# Patient Record
Sex: Female | Born: 1945
Health system: Southern US, Community
[De-identification: ages and names within clinical notes are randomized; demographics above are authoritative.]

## PROBLEM LIST (undated history)

## (undated) DIAGNOSIS — I10 Essential (primary) hypertension: Secondary | ICD-10-CM

## (undated) DIAGNOSIS — M1990 Unspecified osteoarthritis, unspecified site: Secondary | ICD-10-CM

## (undated) DIAGNOSIS — K219 Gastro-esophageal reflux disease without esophagitis: Secondary | ICD-10-CM

## (undated) DIAGNOSIS — D649 Anemia, unspecified: Secondary | ICD-10-CM

## (undated) DIAGNOSIS — M810 Age-related osteoporosis without current pathological fracture: Secondary | ICD-10-CM

## (undated) DIAGNOSIS — E785 Hyperlipidemia, unspecified: Secondary | ICD-10-CM

## (undated) DIAGNOSIS — F329 Major depressive disorder, single episode, unspecified: Secondary | ICD-10-CM

## (undated) DIAGNOSIS — N189 Chronic kidney disease, unspecified: Secondary | ICD-10-CM

## (undated) DIAGNOSIS — K269 Duodenal ulcer, unspecified as acute or chronic, without hemorrhage or perforation: Secondary | ICD-10-CM

## (undated) DIAGNOSIS — G709 Myoneural disorder, unspecified: Secondary | ICD-10-CM

## (undated) DIAGNOSIS — T7840XA Allergy, unspecified, initial encounter: Secondary | ICD-10-CM

## (undated) DIAGNOSIS — H269 Unspecified cataract: Secondary | ICD-10-CM

## (undated) DIAGNOSIS — F32A Depression, unspecified: Secondary | ICD-10-CM

## (undated) HISTORY — PX: BARIATRIC SURGERY: SHX1103

## (undated) HISTORY — DX: Myoneural disorder, unspecified: G70.9

## (undated) HISTORY — DX: Hyperlipidemia, unspecified: E78.5

## (undated) HISTORY — DX: Major depressive disorder, single episode, unspecified: F32.9

## (undated) HISTORY — DX: Depression, unspecified: F32.A

## (undated) HISTORY — DX: Allergy, unspecified, initial encounter: T78.40XA

## (undated) HISTORY — DX: Unspecified osteoarthritis, unspecified site: M19.90

## (undated) HISTORY — DX: Age-related osteoporosis without current pathological fracture: M81.0

## (undated) HISTORY — PX: EYE SURGERY: SHX253

## (undated) HISTORY — PX: COLONOSCOPY: SHX174

## (undated) HISTORY — DX: Gastro-esophageal reflux disease without esophagitis: K21.9

## (undated) HISTORY — PX: TONSILLECTOMY: SUR1361

## (undated) HISTORY — DX: Duodenal ulcer, unspecified as acute or chronic, without hemorrhage or perforation: K26.9

## (undated) HISTORY — PX: OTHER SURGICAL HISTORY: SHX169

## (undated) HISTORY — PX: ABDOMINAL HYSTERECTOMY: SHX81

## (undated) HISTORY — DX: Chronic kidney disease, unspecified: N18.9

## (undated) HISTORY — DX: Anemia, unspecified: D64.9

## (undated) HISTORY — DX: Essential (primary) hypertension: I10

## (undated) HISTORY — DX: Unspecified cataract: H26.9

## (undated) HISTORY — PX: POLYPECTOMY: SHX149

## (undated) HISTORY — PX: APPENDECTOMY: SHX54

---

## 2003-09-03 HISTORY — PX: OTHER SURGICAL HISTORY: SHX169

## 2013-02-17 ENCOUNTER — Encounter: Payer: Self-pay | Admitting: Gastroenterology

## 2013-03-09 ENCOUNTER — Ambulatory Visit (INDEPENDENT_AMBULATORY_CARE_PROVIDER_SITE_OTHER): Payer: Medicare Other | Admitting: Family Medicine

## 2013-03-09 VITALS — BP 120/78 | HR 58 | Temp 97.9°F | Resp 18 | Ht 62.5 in | Wt 193.0 lb

## 2013-03-09 DIAGNOSIS — J309 Allergic rhinitis, unspecified: Secondary | ICD-10-CM | POA: Diagnosis not present

## 2013-03-09 DIAGNOSIS — H1010 Acute atopic conjunctivitis, unspecified eye: Secondary | ICD-10-CM | POA: Diagnosis not present

## 2013-03-09 DIAGNOSIS — H1013 Acute atopic conjunctivitis, bilateral: Secondary | ICD-10-CM

## 2013-03-09 DIAGNOSIS — J329 Chronic sinusitis, unspecified: Secondary | ICD-10-CM

## 2013-03-09 MED ORDER — AZELASTINE HCL 0.05 % OP SOLN: 1.0000 [drp] | Freq: Two times a day (BID) | OPHTHALMIC | Status: DC

## 2013-03-09 MED ORDER — FEXOFENADINE HCL 180 MG PO TABS: 180.0000 mg | ORAL_TABLET | Freq: Every day | ORAL | Status: DC

## 2013-03-09 MED ORDER — PREDNISONE 20 MG PO TABS: ORAL_TABLET | ORAL | Status: DC

## 2013-03-09 MED ORDER — AMOXICILLIN-POT CLAVULANATE 875-125 MG PO TABS: 1.0000 | ORAL_TABLET | Freq: Two times a day (BID) | ORAL | Status: DC

## 2013-03-09 MED ORDER — GUAIFENESIN-CODEINE 100-10 MG/5ML PO SYRP: 5.0000 mL | ORAL_SOLUTION | Freq: Three times a day (TID) | ORAL | Status: DC | PRN

## 2013-03-09 MED ORDER — FLUTICASONE PROPIONATE 50 MCG/ACT NA SUSP: 2.0000 | Freq: Every day | NASAL | Status: DC

## 2013-03-09 NOTE — Patient Instructions (Addendum)
Hot showers or breathing in steam may help loosen the congestion.  Using a netti pot or sinus rinse is also likely to help you feel better and keep this from progressing.  Use the fluticasone nasal spray every night before bed for at least 2 weeks minimum and start daily allegra - if this doesn't work, switch to cetirizine (generic zyrtec).  If no improvement or you are getting worse, come back but hopefully with all of the above, you can avoid it.

## 2013-03-09 NOTE — Progress Notes (Signed)
Subjective:    Patient ID: Rebecca Lopez, female    DOB: [birth year], 66 y.o.   MRN: [id number] Chief Complaint  Patient presents with  . sore throat, cough, watery eyes x5 days   HPI  Started not feeling well about 4d ago but worsened and felt miserable all day and couldn't get out of bed.  Her eyes are really watery and burning. In the morning, her eyes are crusted and constantly weeping bilaterally clear liquid.  Having rhinitis - though this tends to be chronic due to various allergies.  Having a lot of sinus congestion and pain with progressive jaw pain, is having bilateral ear pain and severe sore throat - very painful.  No tinnitus or decreased hearing. No ear drainage but lots of post-nasal drip and coughing up lots of phlegm - did have a few streaks of blood in it last year.  No SHoB or CP. Decreased po, no f/c.  Just got a cat and so is worried she is having an allergic reaction to it.  Has been using allegra and some ibuprofen.    Past Medical History  Diagnosis Date  . Depression   . Allergy   . Arthritis   . Hyperlipidemia    No current outpatient prescriptions on file prior to visit.   No current facility-administered medications on file prior to visit.   No Known Allergies  Review of Systems  Constitutional: Positive for diaphoresis, activity change, appetite change and fatigue. Negative for fever and chills.  HENT: Positive for ear pain, congestion, sore throat, rhinorrhea, sneezing, postnasal drip and sinus pressure. Negative for hearing loss, nosebleeds, facial swelling, trouble swallowing, neck pain, neck stiffness, dental problem, voice change, tinnitus and ear discharge.   Eyes: Positive for pain, discharge, redness and itching. Negative for photophobia and visual disturbance.  Respiratory: Positive for cough. Negative for chest tightness, shortness of breath and wheezing.   Cardiovascular: Negative for chest pain.  Gastrointestinal: Negative for nausea, vomiting and  abdominal pain.  Skin: Negative for rash.  Neurological: Positive for headaches. Negative for dizziness and syncope.  Hematological: Negative for adenopathy.  Psychiatric/Behavioral: Positive for sleep disturbance.      BP 120/78  Pulse 58  Temp(Src) 97.9 F (36.6 C) (Oral)  Resp 18  Ht 5' 2.5" (1.588 m)  Wt 193 lb (87.544 kg)  BMI 34.72 kg/m2  SpO2 96% Objective:   Physical Exam  Constitutional: She is oriented to person, place, and time. She appears well-developed and well-nourished. She appears lethargic. She appears ill. No distress.  HENT:  Head: Normocephalic and atraumatic.  Right Ear: External ear and ear canal normal. Tympanic membrane is injected and retracted. A middle ear effusion is present.  Left Ear: External ear and ear canal normal. Tympanic membrane is retracted. A middle ear effusion is present.  Nose: Mucosal edema and rhinorrhea present. Right sinus exhibits maxillary sinus tenderness. Left sinus exhibits maxillary sinus tenderness.  Mouth/Throat: Uvula is midline and mucous membranes are normal. Posterior oropharyngeal erythema present. No oropharyngeal exudate, posterior oropharyngeal edema or tonsillar abscesses.  Eyes: Right eye exhibits discharge. Right eye exhibits no chemosis and no exudate. Left eye exhibits discharge. Left eye exhibits no chemosis and no exudate. Right conjunctiva is injected. Right conjunctiva has no hemorrhage. Left conjunctiva is injected. Left conjunctiva has no hemorrhage. No scleral icterus.  Neck: Normal range of motion. Neck supple.  Cardiovascular: Normal rate, regular rhythm, normal heart sounds and intact distal pulses.   Pulmonary/Chest: Effort normal and breath sounds normal.  Lymphadenopathy:       Head (right side): Submandibular adenopathy present. No preauricular and no posterior auricular adenopathy present.       Head (left side): Submandibular adenopathy present. No preauricular and no posterior auricular adenopathy  present.    She has no cervical adenopathy.       Right: No supraclavicular adenopathy present.       Left: No supraclavicular adenopathy present.  Neurological: She is oriented to person, place, and time. She appears lethargic.  Skin: Skin is warm and dry. She is not diaphoretic. No erythema.  Psychiatric: She has a normal mood and affect. Her behavior is normal.      Assessment & Plan:  Sinus infection - prednisone and augmentin. Start netti pot qhs.  Allergic conjunctivitis and rhinitis, bilateral Unfortunately, [name] may indeed have a cat allergy but can't give up the cat - not a viable option so start chronic allegra or zyrtec as well as flonase w/ prn optivar.  RTC if sxs cont or worsen. Meds ordered this encounter  Medications  . Citalopram Hydrobromide (CELEXA PO)    Sig: Take by mouth.  . triamterene-hydrochlorothiazide (DYAZIDE) 37.5-25 MG per capsule    Sig: Take 1 capsule by mouth every morning.  Marland Kitchen ibuprofen (ADVIL,MOTRIN) 200 MG tablet    Sig: Take 200 mg by mouth every 6 (six) hours as needed for pain.  . predniSONE (DELTASONE) 20 MG tablet    Sig: Take 3 tabs po qd x 3d, 2 tabs po qd x 3d, 1 tab po qd x 3d    Dispense:  18 tablet    Refill:  0  . amoxicillin-clavulanate (AUGMENTIN) 875-125 MG per tablet    Sig: Take 1 tablet by mouth 2 (two) times daily.    Dispense:  20 tablet    Refill:  0  . fluticasone (FLONASE) 50 MCG/ACT nasal spray    Sig: Place 2 sprays into the nose at bedtime.    Dispense:  16 g    Refill:  6  . fexofenadine (ALLEGRA) 180 MG tablet    Sig: Take 1 tablet (180 mg total) by mouth daily.    Dispense:  30 tablet    Refill:  5  . azelastine (OPTIVAR) 0.05 % ophthalmic solution    Sig: Place 1 drop into both eyes 2 (two) times daily.    Dispense:  6 mL    Refill:  12  . guaiFENesin-codeine (ROBITUSSIN AC) 100-10 MG/5ML syrup    Sig: Take 5 mLs by mouth 3 (three) times daily as needed for cough.    Dispense:  120 mL    Refill:  0

## 2013-03-10 ENCOUNTER — Encounter: Payer: Self-pay | Admitting: Gastroenterology

## 2013-03-10 ENCOUNTER — Ambulatory Visit (INDEPENDENT_AMBULATORY_CARE_PROVIDER_SITE_OTHER): Payer: Medicare Other | Admitting: Gastroenterology

## 2013-03-10 VITALS — BP 110/82 | HR 72 | Ht 62.5 in | Wt 195.2 lb

## 2013-03-10 DIAGNOSIS — R197 Diarrhea, unspecified: Secondary | ICD-10-CM | POA: Diagnosis not present

## 2013-03-10 DIAGNOSIS — Z800 Family history of malignant neoplasm of digestive organs: Secondary | ICD-10-CM

## 2013-03-10 MED ORDER — PEG-KCL-NACL-NASULF-NA ASC-C 100 G PO SOLR: 1.0000 | Freq: Once | ORAL | Status: DC

## 2013-03-10 NOTE — Progress Notes (Addendum)
History of Present Illness: This is a 67 year old female who had a 2 or 3 day episode of diarrhea associated with one episode of fecal incontinence last month. All symptoms resolved spontaneously. She currently has no gastrointestinal complaints. She has a family history of colon cancer in her mother at age 54 and in a sister at age 62. She states she's had at least 3 colonoscopies. Her last colonoscopy was in 2005 at [facility] by Dr. Verne Carrow performed in March 2005 showing only diverticulosis. She had a prior colonoscopy performed in June 2001 with the same findings She has lactose intolerance. Denies weight loss, abdominal pain, constipation, change in stool caliber, melena, hematochezia, nausea, vomiting, dysphagia, reflux symptoms, chest pain.  Review of Systems: Pertinent positive and negative review of systems were noted in the above HPI section. All other review of systems were otherwise negative.  Current Medications, Allergies, Past Medical History, Past Surgical History, Family History and Social History were reviewed in [employer] record.  Physical Exam: General: Well developed , well nourished, no acute distress Head: Normocephalic and atraumatic Eyes:  sclerae anicteric, EOMI Ears: Normal auditory acuity Mouth: No deformity or lesions Neck: Supple, no masses or thyromegaly Lungs: Clear throughout to auscultation Heart: Regular rate and rhythm; no murmurs, rubs or bruits Abdomen: Soft, non tender and non distended. No masses, hepatosplenomegaly or hernias noted. Normal Bowel sounds Rectal: deferred to colonoscopy Musculoskeletal: Symmetrical with no gross deformities  Skin: No lesions on visible extremities Pulses:  Normal pulses noted Extremities: No clubbing, cyanosis, edema or deformities noted Neurological: Alert oriented x 4, grossly nonfocal Cervical Nodes:  No significant cervical adenopathy Inguinal Nodes: No significant  inguinal adenopathy Psychological:  Alert and cooperative. Normal mood and affect  Assessment and Recommendations:  1. Self limited diarrhea, one episode of fecal incontinence. Presumed infectious process that has resovled.  2. Family history of colon cancer-mother and sister. Last colonoscopy 9 years ago. Due to her family history, she should maintain a five-year interval. The risks, benefits, and alternatives to colonoscopy with possible biopsy and possible polypectomy were discussed with the patient and they consent to proceed.

## 2013-03-10 NOTE — Patient Instructions (Addendum)
You have been scheduled for a colonoscopy with propofol. Please follow written instructions given to you at your visit today.  Please pick up your prep kit at the pharmacy within the next 1-3 days. If you use inhalers (even only as needed), please bring them with you on the day of your procedure. Your physician has requested that you go to www.startemmi.com and enter the access code given to you at your visit today. This web site gives a general overview about your procedure. However, you should still follow specific instructions given to you by our office regarding your preparation for the procedure.  Thank you for choosing me and [facility].  Venita Lick. Pleas Koch., MD., [facility]  cc: Norberto Sorenson, MD

## 2013-03-24 ENCOUNTER — Ambulatory Visit (AMBULATORY_SURGERY_CENTER): Payer: Medicare Other | Admitting: Gastroenterology

## 2013-03-24 ENCOUNTER — Encounter: Payer: Self-pay | Admitting: Gastroenterology

## 2013-03-24 VITALS — BP 102/52 | HR 48 | Temp 96.0°F | Resp 18 | Ht 62.5 in | Wt 195.0 lb

## 2013-03-24 DIAGNOSIS — D126 Benign neoplasm of colon, unspecified: Secondary | ICD-10-CM

## 2013-03-24 DIAGNOSIS — Z1211 Encounter for screening for malignant neoplasm of colon: Secondary | ICD-10-CM | POA: Diagnosis not present

## 2013-03-24 DIAGNOSIS — R197 Diarrhea, unspecified: Secondary | ICD-10-CM | POA: Diagnosis not present

## 2013-03-24 DIAGNOSIS — Z800 Family history of malignant neoplasm of digestive organs: Secondary | ICD-10-CM | POA: Diagnosis not present

## 2013-03-24 DIAGNOSIS — E669 Obesity, unspecified: Secondary | ICD-10-CM | POA: Diagnosis not present

## 2013-03-24 MED ORDER — SODIUM CHLORIDE 0.9 % IV SOLN: 500.0000 mL | INTRAVENOUS | Status: DC

## 2013-03-24 NOTE — Patient Instructions (Signed)
YOU HAD AN ENDOSCOPIC PROCEDURE TODAY AT THE [facility]: Refer to the procedure report that was given to you for any specific questions about what was found during the examination.  If the procedure report does not answer your questions, please call your gastroenterologist to clarify.  If you requested that your care partner not be given the details of your procedure findings, then the procedure report has been included in a sealed envelope for you to review at your convenience later.  YOU SHOULD EXPECT: Some feelings of bloating in the abdomen. Passage of more gas than usual.  Walking can help get rid of the air that was put into your GI tract during the procedure and reduce the bloating. If you had a lower endoscopy (such as a colonoscopy or flexible sigmoidoscopy) you may notice spotting of blood in your stool or on the toilet paper. If you underwent a bowel prep for your procedure, then you may not have a normal bowel movement for a few days.  DIET: Your first meal following the procedure should be a light meal and then it is ok to progress to your normal diet.  A half-sandwich or bowl of soup is an example of a good first meal.  Heavy or fried foods are harder to digest and may make you feel nauseous or bloated.  Likewise meals heavy in dairy and vegetables can cause extra gas to form and this can also increase the bloating.  Drink plenty of fluids but you should avoid alcoholic beverages for 24 hours.  ACTIVITY: Your care partner should take you home directly after the procedure.  You should plan to take it easy, moving slowly for the rest of the day.  You can resume normal activity the day after the procedure however you should NOT DRIVE or use heavy machinery for 24 hours (because of the sedation medicines used during the test).    SYMPTOMS TO REPORT IMMEDIATELY: A gastroenterologist can be reached at any hour.  During normal business hours, 8:30 AM to 5:00 PM Monday through Friday,  call [phone].  After hours and on weekends, please call the GI answering service at [phone] who will take a message and have the physician on call contact you.   Following lower endoscopy (colonoscopy or flexible sigmoidoscopy):  Excessive amounts of blood in the stool  Significant tenderness or worsening of abdominal pains  Swelling of the abdomen that is new, acute  Fever of 100F or higher    FOLLOW UP: If any biopsies were taken you will be contacted by phone or by letter within the next 1-3 weeks.  Call your gastroenterologist if you have not heard about the biopsies in 3 weeks.  Our staff will call the home number listed on your records the next business day following your procedure to check on you and address any questions or concerns that you may have at that time regarding the information given to you following your procedure. This is a courtesy call and so if there is no answer at the home number and we have not heard from you through the emergency physician on call, we will assume that you have returned to your regular daily activities without incident.  SIGNATURES/CONFIDENTIALITY: You and/or your care partner have signed paperwork which will be entered into your electronic medical record.  These signatures attest to the fact that that the information above on your After Visit Summary has been reviewed and is understood.  Full responsibility of the confidentiality   of this discharge information lies with you and/or your care-partner.    INFORMATION ON POLYPS,DIVERTICULOS,& HIGH FIBER DIET GIVEN TO YOU TODAY

## 2013-03-24 NOTE — Op Note (Addendum)
[facility] [address] [place], [address]   COLONOSCOPY PROCEDURE REPORT  PATIENT: Rebecca Lopez, Rebecca Lopez  MR#: [id number] BIRTHDATE: [birth year] , 67  yrs. old GENDER: Female ENDOSCOPIST: Meryl Dare, MD, [facility] REFERRED Rod Holler, M.D. PROCEDURE DATE:  03/24/2013 PROCEDURE:   Colonoscopy with snare polypectomy First Screening Colonoscopy - Avg.  risk and is 50 yrs.  old or older - No.  Prior Negative Screening - Now for repeat screening. Above average risk  History of Adenoma - Now for follow-up colonoscopy & has been > or = to 3 yrs.  N/A  Polyps Removed Today? Yes. ASA CLASS:   Class II INDICATIONS:Patient's immediate family history of colon cancer and elevated risk screening. MEDICATIONS: MAC sedation, administered by CRNA and propofol (Diprivan) 180mg  IV DESCRIPTION OF PROCEDURE:   After the risks benefits and alternatives of the procedure were thoroughly explained, informed consent was obtained.  A digital rectal exam revealed no abnormalities of the rectum.   The LB [id number]  endoscope was introduced through the anus and advanced to the cecum, which was identified by both the appendix and ileocecal valve. No adverse events experienced.   The quality of the prep was good, using MoviPrep  The instrument was then slowly withdrawn as the colon was fully examined.  COLON FINDINGS: A sessile polyp measuring 7 mm in size was found at the cecum.  A polypectomy was performed with a cold snare.  The resection was complete and the polyp tissue was completely retrieved.   Moderate diverticulosis was noted at the hepatic flexure, in the transverse colon, descending colon, and sigmoid colon. The colon was otherwise normal. There was no diverticulosis, inflammation, polyps or cancers unless previously stated. Retroflexed views revealed small internal hemorrhoids. The time to cecum=2 minutes 40 seconds.  Withdrawal time=9 minutes 46 seconds. The scope was  withdrawn and the procedure completed. COMPLICATIONS: There were no complications.  ENDOSCOPIC IMPRESSION: 1.   Sessile polyp measuring 7 mm at the cecum; polypectomy performed with a cold snare 2.   Moderate diverticulosis was noted at the hepatic flexure, transverse colon, descending colon, and sigmoid colon 3.   Small internal hemorrhoids  RECOMMENDATIONS: 1.  Await pathology results 2.  High fiber diet with liberal fluid intake. 3.  Repeat Colonoscopy in 5 years.  eSigned:  Meryl Dare, MD, [facility] 03/24/2013 11:00 AM Revised: 03/24/2013 11:00 AM

## 2013-03-24 NOTE — Progress Notes (Signed)
Procedure ends, to recovery, report given and VSS. 

## 2013-03-24 NOTE — Progress Notes (Signed)
Called to room to assist during endoscopic procedure.  Patient ID and intended procedure confirmed with present staff. Received instructions for my participation in the procedure from the performing physician.  

## 2013-03-24 NOTE — Progress Notes (Signed)
Patient did not experience any of the following events: a burn prior to discharge; a fall within the facility; wrong site/side/patient/procedure/implant event; or a hospital transfer or hospital admission upon discharge from the facility. (G8907) Patient did not have preoperative order for IV antibiotic SSI prophylaxis. (G8918)  

## 2013-03-26 ENCOUNTER — Telehealth: Payer: Self-pay | Admitting: *Deleted

## 2013-03-26 NOTE — Telephone Encounter (Signed)
Left message

## 2013-03-31 ENCOUNTER — Encounter: Payer: Self-pay | Admitting: Gastroenterology

## 2013-05-27 ENCOUNTER — Ambulatory Visit (INDEPENDENT_AMBULATORY_CARE_PROVIDER_SITE_OTHER): Payer: Medicare Other | Admitting: Family Medicine

## 2013-05-27 VITALS — BP 118/68 | HR 60 | Temp 98.7°F | Resp 16 | Ht 62.5 in | Wt 191.4 lb

## 2013-05-27 DIAGNOSIS — R5381 Other malaise: Secondary | ICD-10-CM | POA: Diagnosis not present

## 2013-05-27 DIAGNOSIS — Z79899 Other long term (current) drug therapy: Secondary | ICD-10-CM | POA: Diagnosis not present

## 2013-05-27 DIAGNOSIS — F329 Major depressive disorder, single episode, unspecified: Secondary | ICD-10-CM

## 2013-05-27 DIAGNOSIS — I10 Essential (primary) hypertension: Secondary | ICD-10-CM | POA: Diagnosis not present

## 2013-05-27 LAB — POCT CBC
Granulocyte percent: 59.7 %G (ref 37–80)
HCT, POC: 45.7 % (ref 37.7–47.9)
Hemoglobin: 14.6 g/dL (ref 12.2–16.2)
MCV: 101.2 fL — AB (ref 80–97)
MPV: 10 fL (ref 0–99.8)
POC Granulocyte: 3.4 (ref 2–6.9)
POC LYMPH PERCENT: 32.1 %L (ref 10–50)
POC MID %: 8.2 %M (ref 0–12)
Platelet Count, POC: 207 10*3/uL (ref 142–424)
RBC: 4.52 M/uL (ref 4.04–5.48)
RDW, POC: 14.3 %

## 2013-05-27 LAB — COMPREHENSIVE METABOLIC PANEL
AST: 18 U/L (ref 0–37)
Albumin: 4.2 g/dL (ref 3.5–5.2)
Alkaline Phosphatase: 95 U/L (ref 39–117)
BUN: 18 mg/dL (ref 6–23)
Calcium: 10.1 mg/dL (ref 8.4–10.5)
Chloride: 103 mEq/L (ref 96–112)
Potassium: 4.2 mEq/L (ref 3.5–5.3)
Sodium: 138 mEq/L (ref 135–145)
Total Protein: 6.9 g/dL (ref 6.0–8.3)

## 2013-05-27 LAB — LIPID PANEL
Cholesterol: 289 mg/dL — ABNORMAL HIGH (ref 0–200)
HDL: 63 mg/dL (ref >39)
Total CHOL/HDL Ratio: 4.6 Ratio
Triglycerides: 154 mg/dL — ABNORMAL HIGH (ref <150)
VLDL: 31 mg/dL (ref 0–40)

## 2013-05-27 LAB — TSH: TSH: 2.043 u[IU]/mL (ref 0.350–4.500)

## 2013-05-27 MED ORDER — BUPROPION HCL ER (XL) 150 MG PO TB24: 150.0000 mg | ORAL_TABLET | Freq: Every day | ORAL | Status: DC

## 2013-05-27 MED ORDER — TRIAMTERENE-HCTZ 37.5-25 MG PO CAPS: 1.0000 | ORAL_CAPSULE | ORAL | Status: DC

## 2013-05-27 MED ORDER — PROPRANOLOL HCL ER 80 MG PO CP24: 80.0000 mg | ORAL_CAPSULE | Freq: Every day | ORAL | Status: DC

## 2013-05-27 NOTE — Patient Instructions (Signed)
Start taking wellbutrin XL 150mg  once a day and continue your citalopram 20mg . In 1 wk, cut your citalopram in half to 10mg .  1 week after that (so 2 wks from today) go up to 2 tabs of your wellbutrin XL a day (to equal 300mg ) and in 1 more week (so 3 wks from today) stop your citalopram.  Then when you need a refill on your wellbutrin, call into clinic (or send an email over MyChart) letting me know how you are doing and we will change your dose of wellbutrin XL to 300mg  daily so it is just 1 tab/day.

## 2013-05-27 NOTE — Progress Notes (Signed)
Subjective:    Patient ID: Rebecca Lopez, female    DOB: 04-14-1946, 67 y.o.   MRN: [id number] Chief Complaint  Patient presents with  . Depression    wants to talk to Dr Clelia Croft about changing meds    HPI  Had been taking wellbutrin in addition to the celexa but felt like she was taking to much so went down and off of it and was doing well initially but now noticing that she is really fatigued and not finishing projects, and feeling very tense. Her life is circumstantially better than previously.  Has been on celexa for about 15 yrs.  Then her daughter died and so she added on the wellbutrin 4 yrs ago.  Had been on some ssris previously intmettiently during other tough times - divorce, father's death.  Has gone to counseling occ but unsure if it really helped.   Mood sxs do typically get worse over the winter period.   She is dating now and her partner does shift work so she will stay up from midnight to 3:30-4 to see her boyfriend.    Past Medical History  Diagnosis Date  . Depression   . Allergy   . Arthritis   . Hyperlipidemia    Current Outpatient Prescriptions on File Prior to Visit  Medication Sig Dispense Refill  . fexofenadine (ALLEGRA) 180 MG tablet Take 1 tablet (180 mg total) by mouth daily.  30 tablet  5  . fluticasone (FLONASE) 50 MCG/ACT nasal spray Place 2 sprays into the nose at bedtime.  16 g  6  . ibuprofen (ADVIL,MOTRIN) 200 MG tablet Take 200 mg by mouth every 6 (six) hours as needed for pain.       No current facility-administered medications on file prior to visit.   No Known Allergies  Review of Systems  Constitutional: Positive for fatigue. Negative for fever, chills, diaphoresis and appetite change.  Eyes: Negative for visual disturbance.  Respiratory: Negative for cough and shortness of breath.   Cardiovascular: Negative for chest pain, palpitations and leg swelling.  Genitourinary: Negative for decreased urine volume.  Neurological: Negative for syncope  and headaches.  Hematological: Does not bruise/bleed easily.  Psychiatric/Behavioral: Positive for sleep disturbance, dysphoric mood and decreased concentration. The patient is nervous/anxious.       BP 118/68  Pulse 60  Temp(Src) 98.7 F (37.1 C) (Oral)  Resp 16  Ht 5' 2.5" (1.588 m)  Wt 191 lb 6.4 oz (86.818 kg)  BMI 34.43 kg/m2  SpO2 98% Objective:   Physical Exam  Constitutional: She is oriented to person, place, and time. She appears well-developed and well-nourished. No distress.  HENT:  Head: Normocephalic and atraumatic.  Right Ear: External ear normal.  Left Ear: External ear normal.  Eyes: Conjunctivae are normal. No scleral icterus.  Neck: Normal range of motion. Neck supple. No thyromegaly present.  Cardiovascular: Normal rate, regular rhythm, normal heart sounds and intact distal pulses.   Pulmonary/Chest: Effort normal and breath sounds normal. No respiratory distress.  Musculoskeletal: She exhibits no edema.  Lymphadenopathy:    She has no cervical adenopathy.  Neurological: She is alert and oriented to person, place, and time.  Skin: Skin is warm and dry. She is not diaphoretic. No erythema.  Psychiatric: She has a normal mood and affect. Her behavior is normal.          Assessment & Plan:   Essential hypertension, benign - Plan: Comprehensive metabolic panel, POCT CBC  Encounter for long-term (current) use  of other medications - Plan: Comprehensive metabolic panel, POCT CBC, TSH, Lipid panel  Other malaise and fatigue - Plan: Comprehensive metabolic panel, POCT CBC, TSH, Lipid panel  Depression  Meds ordered this encounter  Medications  . DISCONTINUED: citalopram (CELEXA) 20 MG tablet    Sig: Take 20 mg by mouth daily.  . propranolol ER (INDERAL LA) 80 MG 24 hr capsule    Sig: Take 1 capsule (80 mg total) by mouth daily.    Dispense:  90 capsule    Refill:  3  . triamterene-hydrochlorothiazide (DYAZIDE) 37.5-25 MG per capsule    Sig: Take 1  each (1 capsule total) by mouth every morning.    Dispense:  90 capsule    Refill:  3  . buPROPion (WELLBUTRIN XL) 150 MG 24 hr tablet    Sig: Take 1 tablet (150 mg total) by mouth daily. Take 1 tablet by mouth x 2 wks, the increase to 2 tablets by mouth    Dispense:  60 tablet    Refill:  1    Norberto Sorenson, MD MPH

## 2013-07-08 ENCOUNTER — Telehealth: Payer: Self-pay

## 2013-07-08 MED ORDER — BUPROPION HCL ER (XL) 300 MG PO TB24: 300.0000 mg | ORAL_TABLET | Freq: Every day | ORAL | Status: DC

## 2013-07-08 NOTE — Telephone Encounter (Signed)
Called her. She is well on the dose. She is on 300 mg Wellbutrin XL, and wants this sent in. Pended. Please advise.

## 2013-07-08 NOTE — Telephone Encounter (Signed)
Patient saw dr Clelia Croft and she put her on wellbutrin, she was told to call back if working so they could get the dosage correct for this please call patient at [phone]

## 2013-07-08 NOTE — Telephone Encounter (Signed)
Great. Thanks. rx sent.

## 2013-07-22 ENCOUNTER — Encounter: Payer: Self-pay | Admitting: Family Medicine

## 2013-07-26 ENCOUNTER — Ambulatory Visit (INDEPENDENT_AMBULATORY_CARE_PROVIDER_SITE_OTHER): Payer: Medicare Other | Admitting: Emergency Medicine

## 2013-07-26 VITALS — BP 143/88 | HR 60 | Temp 98.4°F | Resp 17 | Wt 183.0 lb

## 2013-07-26 DIAGNOSIS — Z20828 Contact with and (suspected) exposure to other viral communicable diseases: Secondary | ICD-10-CM | POA: Diagnosis not present

## 2013-07-26 DIAGNOSIS — Z113 Encounter for screening for infections with a predominantly sexual mode of transmission: Secondary | ICD-10-CM | POA: Diagnosis not present

## 2013-07-26 DIAGNOSIS — N898 Other specified noninflammatory disorders of vagina: Secondary | ICD-10-CM

## 2013-07-26 DIAGNOSIS — Z7251 High risk heterosexual behavior: Secondary | ICD-10-CM

## 2013-07-26 DIAGNOSIS — R21 Rash and other nonspecific skin eruption: Secondary | ICD-10-CM

## 2013-07-26 LAB — POCT WET PREP WITH KOH
Trichomonas, UA: NEGATIVE
Yeast Wet Prep HPF POC: NEGATIVE

## 2013-07-26 NOTE — Progress Notes (Signed)
  Subjective:    Patient ID: Rebecca Lopez, female    DOB: 1945/09/28, 67 y.o.   MRN: [id number]  HPI patient here requesting evaluation for possible STD exposure. Her partner carries antibodies to herpes. She has had protected vaginal intercourse as well as unprotected oral intercourse. She recently learned he has antibodies for herpes type II and is concerned about this. She has a red scaly area on the left side of her nose she would like checked    Review of Systems     Objective:   Physical Exam patient is alert and cooperative she is not in any distress. Her neck is supple. Chest clear. Abdomen soft nontender. She has atrophic vaginitis with a whitish discharge at the base of the vaginal vault .  Results for orders placed in visit on 07/26/13  POCT WET PREP WITH KOH      Result Value Range   Trichomonas, UA Negative     Clue Cells Wet Prep HPF POC 0-2     Epithelial Wet Prep HPF POC 3-14     Yeast Wet Prep HPF POC neg     Bacteria Wet Prep HPF POC 1+     RBC Wet Prep HPF POC 0-6     WBC Wet Prep HPF POC 4-18     KOH Prep POC Negative          Assessment & Plan:  Specimen obtained for wet prep . STD blood work was done as well as a Management consultant. Patient is status post hysterectomy and bilateral oophorectomy.

## 2013-07-27 LAB — HSV(HERPES SIMPLEX VRS) I + II AB-IGG: HSV 2 Glycoprotein G Ab, IgG: <0.1 IV

## 2013-07-27 LAB — SYPHILIS: RPR W/REFLEX TO RPR TITER AND TREPONEMAL ANTIBODIES, TRADITIONAL SCREENING AND DIAGNOSIS ALGORITHM

## 2013-07-27 LAB — GC/CHLAMYDIA PROBE AMP: GC Probe RNA: NEGATIVE

## 2013-08-10 ENCOUNTER — Ambulatory Visit (INDEPENDENT_AMBULATORY_CARE_PROVIDER_SITE_OTHER): Payer: Medicare Other | Admitting: Radiology

## 2013-08-10 DIAGNOSIS — Z23 Encounter for immunization: Secondary | ICD-10-CM | POA: Diagnosis not present

## 2013-09-13 ENCOUNTER — Ambulatory Visit (INDEPENDENT_AMBULATORY_CARE_PROVIDER_SITE_OTHER): Payer: Medicare Other | Admitting: Family Medicine

## 2013-09-13 VITALS — BP 108/82 | HR 70 | Temp 97.8°F | Resp 20 | Ht 62.0 in | Wt 180.0 lb

## 2013-09-13 DIAGNOSIS — R81 Glycosuria: Secondary | ICD-10-CM | POA: Diagnosis not present

## 2013-09-13 DIAGNOSIS — N390 Urinary tract infection, site not specified: Secondary | ICD-10-CM

## 2013-09-13 DIAGNOSIS — R319 Hematuria, unspecified: Secondary | ICD-10-CM | POA: Diagnosis not present

## 2013-09-13 LAB — BASIC METABOLIC PANEL
BUN: 19 mg/dL (ref 6–23)
CALCIUM: 10.1 mg/dL (ref 8.4–10.5)
CO2: 23 meq/L (ref 19–32)
Chloride: 104 mEq/L (ref 96–112)
Creat: 1.07 mg/dL (ref 0.50–1.10)
GLUCOSE: 79 mg/dL (ref 70–99)
POTASSIUM: 3.4 meq/L — AB (ref 3.5–5.3)
Sodium: 139 mEq/L (ref 135–145)

## 2013-09-13 LAB — POCT URINALYSIS DIPSTICK
Glucose, UA: 100
Ketones, UA: 40
Nitrite, UA: POSITIVE
Spec Grav, UA: 1.01
Urobilinogen, UA: >=8
pH, UA: 8.5

## 2013-09-13 LAB — POCT CBC
Granulocyte percent: 77.7 %G (ref 37–80)
HCT, POC: 45.3 % (ref 37.7–47.9)
Hemoglobin: 14.3 g/dL (ref 12.2–16.2)
Lymph, poc: 1.8 (ref 0.6–3.4)
MCH, POC: 31.4 pg — AB (ref 27–31.2)
MCHC: 31.6 g/dL — AB (ref 31.8–35.4)
MCV: 99.4 fL — AB (ref 80–97)
MID (cbc): 0.7 (ref 0–0.9)
MPV: 9.7 fL (ref 0–99.8)
POC GRANULOCYTE: 8.5 — AB (ref 2–6.9)
POC LYMPH %: 16 % (ref 10–50)
POC MID %: 6.3 % (ref 0–12)
Platelet Count, POC: 192 10*3/uL (ref 142–424)
RBC: 4.56 M/uL (ref 4.04–5.48)
RDW, POC: 13.4 %
WBC: 11 10*3/uL — AB (ref 4.6–10.2)

## 2013-09-13 LAB — POCT UA - MICROSCOPIC ONLY
BACTERIA, U MICROSCOPIC: NEGATIVE
CRYSTALS, UR, HPF, POC: NEGATIVE
Casts, Ur, LPF, POC: NEGATIVE
Epithelial cells, urine per micros: NEGATIVE
MUCUS UA: NEGATIVE
YEAST UA: NEGATIVE

## 2013-09-13 LAB — GLUCOSE, POCT (MANUAL RESULT ENTRY): POC Glucose: 71 mg/dl (ref 70–99)

## 2013-09-13 MED ORDER — CIPROFLOXACIN HCL 500 MG PO TABS: 500.0000 mg | ORAL_TABLET | Freq: Two times a day (BID) | ORAL | Status: DC

## 2013-09-13 NOTE — Progress Notes (Addendum)
[facility] [address], [place] [address] [phone]  Date:  09/13/2013   Name:  Rebecca Lopez   DOB:  Jan 31, 1946   MRN:  [id number]  PCP:  Delman Cheadle, MD    Chief Complaint: Hematuria, Abdominal Pain and frequent urine   History of Present Illness:  Rebecca Lopez is a 68 y.o. very pleasant female patient who presents with the following:  She is here today with likely UTI.  She noted pain on urination, a "lot of pressure."  She noted the blood in her urine yesterday am.   This does seem like a UTI to her- just a lot worse than usual.   She has not noted a fever. No nausea or vomiting.  She also notes some back pain.  However, she does have OA at baseline.   She has not taken any pyridium as of yet.   Last UTI was many years ago She has never had a kidney stone that she knows of.   She has noted herself urinationg some clots at home    She is s/p hysterecomy  She does have HTN Patient Active Problem List   Diagnosis Date Noted  . Family history of malignant neoplasm of gastrointestinal tract 03/10/2013    Past Medical History  Diagnosis Date  . Depression   . Allergy   . Arthritis   . Hyperlipidemia     Past Surgical History  Procedure Laterality Date  . Appendectomy    . Abdominal hysterectomy    . Bariatric surgery    . Arm surgery    . Ctr Bilateral 2005    had right hand done 1999  . Tonsillectomy      History  Substance Use Topics  . Smoking status: Never Smoker   . Smokeless tobacco: Never Used  . Alcohol Use: Yes     Comment: 3-4 glasses per week    Family History  Problem Relation Age of Onset  . Hypertension Mother   . Colon cancer Mother   . Colon polyps Mother   . Heart disease Father   . Cancer Sister   . Colon cancer Sister   . Stroke Maternal Grandfather     No Known Allergies  Medication list has been reviewed and updated.  Current Outpatient Prescriptions on File Prior to Visit  Medication Sig  Dispense Refill  . buPROPion (WELLBUTRIN XL) 300 MG 24 hr tablet Take 1 tablet (300 mg total) by mouth daily.  90 tablet  2  . fexofenadine (ALLEGRA) 180 MG tablet Take 1 tablet (180 mg total) by mouth daily.  30 tablet  5  . fluticasone (FLONASE) 50 MCG/ACT nasal spray Place 2 sprays into the nose at bedtime.  16 g  6  . propranolol ER (INDERAL LA) 80 MG 24 hr capsule Take 1 capsule (80 mg total) by mouth daily.  90 capsule  3  . triamterene-hydrochlorothiazide (DYAZIDE) 37.5-25 MG per capsule Take 1 each (1 capsule total) by mouth every morning.  90 capsule  3   No current facility-administered medications on file prior to visit.    Review of Systems:  As per HPI- otherwise negative.   Physical Examination: Filed Vitals:   09/13/13 1218  BP: 108/82  Pulse: 70  Temp: 97.8 F (36.6 C)  Resp: 20   Filed Vitals:   09/13/13 1218  Height: 5\' 2"  (8.413 m)  Weight: 180 lb (81.647 kg)   Body mass index is 32.91 kg/(m^2). Ideal  Body Weight: Weight in (lb) to have BMI = 25: 136.4  GEN: WDWN, NAD, Non-toxic, A & O x 3, overweight, looks well HEENT: Atraumatic, Normocephalic. Neck supple. No masses, No LAD. Ears and Nose: No external deformity. CV: RRR, No M/G/R. No JVD. No thrill. No extra heart sounds. PULM: CTA B, no wheezes, crackles, rhonchi. No retractions. No resp. distress. No accessory muscle use. ABD: S, NT, ND, +BS. No rebound. No HSM.  Port from her lap band is palpable in her abdomen  EXTR: No c/c/e NEURO Normal gait.  PSYCH: Normally interactive. Conversant. Not depressed or anxious appearing.  Calm demeanor.    Results for orders placed in visit on 09/13/13  POCT URINALYSIS DIPSTICK      Result Value Range   Color, UA red     Clarity, UA turbid     Glucose, UA 100     Bilirubin, UA large     Ketones, UA 40     Spec Grav, UA 1.010     Blood, UA large     pH, UA 8.5     Protein, UA >=300     Urobilinogen, UA >=8.0     Nitrite, UA positive     Leukocytes, UA  large (3+)    POCT UA - MICROSCOPIC ONLY      Result Value Range   WBC, Ur, HPF, POC 12-15     RBC, urine, microscopic TNTC     Bacteria, U Microscopic neg     Mucus, UA neg     Epithelial cells, urine per micros neg     Crystals, Ur, HPF, POC neg     Casts, Ur, LPF, POC neg     Yeast, UA neg    GLUCOSE, POCT (MANUAL RESULT ENTRY)      Result Value Range   POC Glucose 71  70 - 99 mg/dl  POCT CBC      Result Value Range   WBC 11.0 (*) 4.6 - 10.2 K/uL   Lymph, poc 1.8  0.6 - 3.4   POC LYMPH PERCENT 16.0  10 - 50 %L   MID (cbc) 0.7  0 - 0.9   POC MID % 6.3  0 - 12 %M   POC Granulocyte 8.5 (*) 2 - 6.9   Granulocyte percent 77.7  37 - 80 %G   RBC 4.56  4.04 - 5.48 M/uL   Hemoglobin 14.3  12.2 - 16.2 g/dL   HCT, POC 45.3  37.7 - 47.9 %   MCV 99.4 (*) 80 - 97 fL   MCH, POC 31.4 (*) 27 - 31.2 pg   MCHC 31.6 (*) 31.8 - 35.4 g/dL   RDW, POC 13.4     Platelet Count, POC 192  142 - 424 K/uL   MPV 9.7  0 - 99.8 fL     Assessment and Plan: UTI (urinary tract infection) - Plan: ciprofloxacin (CIPRO) 500 MG tablet, Basic metabolic panel, Urine culture  Hematuria, unspecified - Plan: POCT urinalysis dipstick, POCT UA - Microscopic Only, Basic metabolic panel, POCT CBC  Glycosuria - Plan: POCT glucose (manual entry)  UTI with hematuria likely. Start treatment today with cipro.  If not better in 24 hours consider further imaging for possible nephrolithiasis.   See patient instructions for more details.   Will plan further follow- up pending labs. Explained that there is an increased risk of seizure with the combo of cipro and wellbutrin, and increased risk of tendon rupture with cipro.  However, a  quinolone is best for possible pyelo.  She is ok with this risk   Signed Lamar Blinks, MD  Received urine culture and called on 1/14.  Unable to reach at home or cell but LMOM at both numbers,  Her urine culture grew just a few bacterial colonies.  Her sx are probably not due to a UTI  after all.  We need to check on her and possibly order a CT scan.  Please come into clinic today if possible and we can re-evaluate her and send for a scan if necessary.

## 2013-09-13 NOTE — Patient Instructions (Addendum)
Try some pyridum OTC (azo is one brand) to relieve your urinary pain.    Drink plenty of water Use the cipro antibiotic as directed If you do not see improvement tomorrow please give me a call- Sooner if worse.

## 2013-09-14 LAB — URINE CULTURE: Colony Count: 9000

## 2013-09-15 ENCOUNTER — Ambulatory Visit
Admission: RE | Admit: 2013-09-15 | Discharge: 2013-09-15 | Disposition: A | Discharge status: Home / Self Care | Payer: Medicare Other | Source: Ambulatory Visit | Attending: Emergency Medicine | Admitting: Emergency Medicine

## 2013-09-15 ENCOUNTER — Ambulatory Visit (INDEPENDENT_AMBULATORY_CARE_PROVIDER_SITE_OTHER): Payer: Medicare Other | Admitting: Emergency Medicine

## 2013-09-15 ENCOUNTER — Ambulatory Visit: Payer: Medicare Other

## 2013-09-15 ENCOUNTER — Telehealth: Payer: Self-pay

## 2013-09-15 VITALS — BP 100/60 | HR 56 | Temp 98.2°F | Resp 16 | Ht 62.5 in | Wt 180.0 lb

## 2013-09-15 DIAGNOSIS — R350 Frequency of micturition: Secondary | ICD-10-CM

## 2013-09-15 DIAGNOSIS — R109 Unspecified abdominal pain: Secondary | ICD-10-CM

## 2013-09-15 DIAGNOSIS — D7389 Other diseases of spleen: Secondary | ICD-10-CM | POA: Diagnosis not present

## 2013-09-15 DIAGNOSIS — R300 Dysuria: Secondary | ICD-10-CM

## 2013-09-15 DIAGNOSIS — K5730 Diverticulosis of large intestine without perforation or abscess without bleeding: Secondary | ICD-10-CM | POA: Diagnosis not present

## 2013-09-15 LAB — POCT URINALYSIS DIPSTICK
Blood, UA: NEGATIVE
Glucose, UA: 100
Nitrite, UA: POSITIVE
PH UA: 5
PROTEIN UA: 100
Spec Grav, UA: 1.015
Urobilinogen, UA: 4

## 2013-09-15 LAB — POCT UA - MICROSCOPIC ONLY
Bacteria, U Microscopic: NEGATIVE
Crystals, Ur, HPF, POC: NEGATIVE
Mucus, UA: NEGATIVE
RBC, urine, microscopic: NEGATIVE
YEAST UA: NEGATIVE

## 2013-09-15 LAB — POCT CBC
GRANULOCYTE PERCENT: 60.6 % (ref 37–80)
HEMATOCRIT: 44 % (ref 37.7–47.9)
Hemoglobin: 13.7 g/dL (ref 12.2–16.2)
LYMPH, POC: 1.9 (ref 0.6–3.4)
MCH, POC: 31.1 pg (ref 27–31.2)
MCHC: 31.1 g/dL — AB (ref 31.8–35.4)
MCV: 99.7 fL — AB (ref 80–97)
MID (cbc): 0.5 (ref 0–0.9)
MPV: 10 fL (ref 0–99.8)
POC Granulocyte: 3.7 (ref 2–6.9)
POC LYMPH %: 30.9 % (ref 10–50)
POC MID %: 8.5 %M (ref 0–12)
Platelet Count, POC: 196 10*3/uL (ref 142–424)
RBC: 4.41 M/uL (ref 4.04–5.48)
RDW, POC: 14 %
WBC: 6.1 10*3/uL (ref 4.6–10.2)

## 2013-09-15 NOTE — Addendum Note (Signed)
Addended by: Madie Reno on: 09/15/2013 06:45 PM   Modules accepted: Orders

## 2013-09-15 NOTE — Telephone Encounter (Signed)
Spoke to [place], pt's daughter (on HIPPA). She had gotten message from Dr Lorelei Pont and has [facility] for pt on her cell #. Pt is staying at a friends for a few days so can't reach at [facility] # right now. LMOM for pt (on cell #) to CB.

## 2013-09-15 NOTE — Patient Instructions (Signed)
Please go to [facility] for CT scan

## 2013-09-15 NOTE — Telephone Encounter (Signed)
Pt Cb and I gave her instr's to RTC for re-check of gross hematuria. Pt agreed. Dr Lorelei Pont, Juluis Rainier

## 2013-09-15 NOTE — Telephone Encounter (Signed)
Patient returned Elwyn Reach call .   Please call again.   [phone]

## 2013-09-15 NOTE — Telephone Encounter (Signed)
Message copied by Dallas Schimke on Wed Sep 15, 2013  9:41 AM ------      Message from: Lamar Blinks C      Created: Wed Sep 15, 2013  8:59 AM       Hi Pamala Hurry- this is the lady I told you about.  I would like her to come in if possible.  We need to look at her urine to make sure it is clearing up.  She may need a CT scan if not better to look for a stone.  Thanks so much- JC ------

## 2013-09-15 NOTE — Progress Notes (Addendum)
Subjective:    Patient ID: Rebecca Lopez, female    DOB: [birth year], 68 y.o.   MRN: [id number] This chart was scribed for Darlyne Russian, MD by Anastasia Pall, ED Scribe. This patient was seen in room 13 and the patient's care was started at 1:47 PM.  Chief Complaint  Patient presents with  . Follow-up    UTI    HPI Rebecca Lopez is a 68 y.o. female who presents to the [facility] for a follow up for recent UTI.   She denies any bad reaction to her antibiotic. She reports she was feeling better with the medication, but states her urinary frequency and pressure like sensation when urinating, onset lasts night. She denies h/o kidney stone. She denies having imaging done at her last visit. She reports mild abdominal pain, stating it feels more like soreness than pain.    PCP Delman Cheadle, MD  Patient Active Problem List   Diagnosis Date Noted  . Family history of malignant neoplasm of gastrointestinal tract 03/10/2013   Family History  Problem Relation Age of Onset  . Hypertension Mother   . Colon cancer Mother   . Colon polyps Mother   . Heart disease Father   . Cancer Sister   . Colon cancer Sister   . Stroke Maternal Grandfather    No Known Allergies   Review of Systems  Constitutional: Negative for fever.  Gastrointestinal: Positive for abdominal pain.  Genitourinary: Positive for dysuria and frequency.  Musculoskeletal: Negative for back pain.       Objective:   Physical Exam  Nursing note and vitals reviewed. Constitutional: She is oriented to person, place, and time. She appears well-developed and well-nourished. No distress.  HENT:  Head: Normocephalic and atraumatic.  Eyes: EOM are normal.  Neck: Neck supple. No tracheal deviation present.  Cardiovascular: Normal rate.   Pulmonary/Chest: Effort normal. No respiratory distress.  Abdominal: Soft. She exhibits no distension. There is tenderness (MIld deep LLQ tenderness to palpation). There is no rebound and no  guarding.  Palpable port present over LUQ from abdomen surgery.   Musculoskeletal: Normal range of motion.  Neurological: She is alert and oriented to person, place, and time.  Skin: Skin is warm and dry.  Psychiatric: She has a normal mood and affect. Her behavior is normal.   [facility] reading (PRIMARY) by Dr. Everlene Farrier    BP 100/60  Pulse 56  Temp(Src) 98.2 F (36.8 C) (Oral)  Resp 16  Ht 5' 2.5" (1.588 m)  Wt 180 lb (81.647 kg)  BMI 32.38 kg/m2  SpO2 96%  Results for orders placed in visit on 09/15/13  POCT URINALYSIS DIPSTICK      Result Value Range   Color, UA orange     Clarity, UA clear     Glucose, UA 100     Bilirubin, UA moderate     Ketones, UA trace     Spec Grav, UA 1.015     Blood, UA neg     pH, UA 5.0     Protein, UA 100     Urobilinogen, UA 4.0     Nitrite, UA positive     Leukocytes, UA large (3+)    POCT UA - MICROSCOPIC ONLY      Result Value Range   WBC, Ur, HPF, POC 4-6     RBC, urine, microscopic neg     Bacteria, U Microscopic neg     Mucus, UA neg     Epithelial  cells, urine per micros 1-2     Crystals, Ur, HPF, POC neg     Casts, Ur, LPF, POC renal tubular and granular cast     Yeast, UA neg       [facility] reading (PRIMARY) by  Dr. Everlene Farrier there is evidence of a previous gastric surgery there is a questionable calcific density adjacent to the transverse process at L4 approximately 3-4 mm in size Results for orders placed in visit on 09/15/13  POCT URINALYSIS DIPSTICK      Result Value Range   Color, UA orange     Clarity, UA clear     Glucose, UA 100     Bilirubin, UA moderate     Ketones, UA trace     Spec Grav, UA 1.015     Blood, UA neg     pH, UA 5.0     Protein, UA 100     Urobilinogen, UA 4.0     Nitrite, UA positive     Leukocytes, UA large (3+)    POCT UA - MICROSCOPIC ONLY      Result Value Range   WBC, Ur, HPF, POC 4-6     RBC, urine, microscopic neg     Bacteria, U Microscopic neg     Mucus, UA neg     Epithelial cells, urine per  micros 1-2     Crystals, Ur, HPF, POC neg     Casts, Ur, LPF, POC renal tubular and granular cast     Yeast, UA neg    POCT CBC      Result Value Range   WBC 6.1  4.6 - 10.2 K/uL   Lymph, poc 1.9  0.6 - 3.4   POC LYMPH PERCENT 30.9  10 - 50 %L   MID (cbc) 0.5  0 - 0.9   POC MID % 8.5  0 - 12 %M   POC Granulocyte 3.7  2 - 6.9   Granulocyte percent 60.6  37 - 80 %G   RBC 4.41  4.04 - 5.48 M/uL   Hemoglobin 13.7  12.2 - 16.2 g/dL   HCT, POC 44.0  37.7 - 47.9 %   MCV 99.7 (*) 80 - 97 fL   MCH, POC 31.1  27 - 31.2 pg   MCHC 31.1 (*) 31.8 - 35.4 g/dL   RDW, POC 14.0     Platelet Count, POC 196  142 - 424 K/uL   MPV 10.0  0 - 99.8 fL  .  Assessment & Plan:  Ordered KUB, CBC possibility of a stone on KUB we'll proceed with CT abdomen and pelvis oral contrast the. Her white count is down to normal and her urine under the microscope is improved. We'll go ahead and check a scan to be sure we are not dealing with a stone.     I personally performed the services described in this documentation, which was scribed in my presence. The recorded information has been reviewed and is accurate.

## 2013-09-16 LAB — COMPREHENSIVE METABOLIC PANEL
ALK PHOS: 82 U/L (ref 39–117)
ALT: 12 U/L (ref 0–35)
AST: 19 U/L (ref 0–37)
Albumin: 3.9 g/dL (ref 3.5–5.2)
BILIRUBIN TOTAL: 0.4 mg/dL (ref 0.3–1.2)
BUN: 19 mg/dL (ref 6–23)
CO2: 25 mEq/L (ref 19–32)
Calcium: 9.7 mg/dL (ref 8.4–10.5)
Chloride: 104 mEq/L (ref 96–112)
Creat: 1.44 mg/dL — ABNORMAL HIGH (ref 0.50–1.10)
Glucose, Bld: 68 mg/dL — ABNORMAL LOW (ref 70–99)
Potassium: 3.4 mEq/L — ABNORMAL LOW (ref 3.5–5.3)
SODIUM: 141 meq/L (ref 135–145)
TOTAL PROTEIN: 6.7 g/dL (ref 6.0–8.3)

## 2013-09-18 ENCOUNTER — Ambulatory Visit (INDEPENDENT_AMBULATORY_CARE_PROVIDER_SITE_OTHER): Payer: Medicare Other | Admitting: Emergency Medicine

## 2013-09-18 VITALS — BP 114/74 | HR 56 | Temp 98.3°F | Resp 16 | Ht 62.0 in | Wt 179.6 lb

## 2013-09-18 DIAGNOSIS — R319 Hematuria, unspecified: Secondary | ICD-10-CM | POA: Diagnosis not present

## 2013-09-18 DIAGNOSIS — R899 Unspecified abnormal finding in specimens from other organs, systems and tissues: Secondary | ICD-10-CM

## 2013-09-18 DIAGNOSIS — R6889 Other general symptoms and signs: Secondary | ICD-10-CM

## 2013-09-18 LAB — POCT CBC
Granulocyte percent: 71.7 %G (ref 37–80)
HCT, POC: 46.2 % (ref 37.7–47.9)
Hemoglobin: 14.7 g/dL (ref 12.2–16.2)
LYMPH, POC: 1.5 (ref 0.6–3.4)
MCH, POC: 31.4 pg — AB (ref 27–31.2)
MCHC: 31.8 g/dL (ref 31.8–35.4)
MCV: 98.7 fL — AB (ref 80–97)
MID (cbc): 0.5 (ref 0–0.9)
MPV: 9.7 fL (ref 0–99.8)
PLATELET COUNT, POC: 237 10*3/uL (ref 142–424)
POC Granulocyte: 5.1 (ref 2–6.9)
POC LYMPH PERCENT: 21.4 %L (ref 10–50)
POC MID %: 6.9 %M (ref 0–12)
RBC: 4.68 M/uL (ref 4.04–5.48)
RDW, POC: 13.3 %
WBC: 7.1 10*3/uL (ref 4.6–10.2)

## 2013-09-18 LAB — POCT URINALYSIS DIPSTICK
Bilirubin, UA: NEGATIVE
Blood, UA: NEGATIVE
Glucose, UA: NEGATIVE
Ketones, UA: NEGATIVE
LEUKOCYTES UA: NEGATIVE
Nitrite, UA: POSITIVE
Protein, UA: NEGATIVE
Renal tubular cells: POSITIVE
Spec Grav, UA: 1.02
Urobilinogen, UA: 0.2
pH, UA: 5.5

## 2013-09-18 LAB — POCT UA - MICROSCOPIC ONLY
CASTS, UR, LPF, POC: NEGATIVE
Crystals, Ur, HPF, POC: NEGATIVE
Mucus, UA: POSITIVE
Renal tubular cells: POSITIVE
Yeast, UA: NEGATIVE

## 2013-09-18 LAB — BASIC METABOLIC PANEL
BUN: 22 mg/dL (ref 6–23)
CALCIUM: 9.9 mg/dL (ref 8.4–10.5)
CO2: 26 mEq/L (ref 19–32)
Chloride: 103 mEq/L (ref 96–112)
Creat: 1.28 mg/dL — ABNORMAL HIGH (ref 0.50–1.10)
Glucose, Bld: 95 mg/dL (ref 70–99)
Potassium: 3.2 mEq/L — ABNORMAL LOW (ref 3.5–5.3)
SODIUM: 139 meq/L (ref 135–145)

## 2013-09-18 NOTE — Progress Notes (Addendum)
Subjective:   This chart was scribed for Nena Jordan MD by Forrestine Him, ED Scribe. This patient was seen in room Room 2 and the patient's care was started 10:55 AM.   Patient ID: Rebecca Lopez, female    DOB: 02-22-1946, 68 y.o.   MRN: [id number]  HPI  HPI Comments: Rebecca Lopez is a 68 y.o. female who presents to [facility] seeking a follow-up for a UTI from her visit on 1/14 today. Pt states she has not been well since 1/12. She reports gradual onset, unchanged, moderate fatigue, hip pain, abdominal discomfort, and a constant HA. She admits to a decrease in appetite in the last couple of days. She denies fever. Pt states she is still taking ciprofloxacin 500 mg prescribed from her last visit.   Review of Systems  Constitutional: Positive for fatigue. Negative for fever.  Gastrointestinal: Positive for abdominal pain (abdominal discomfort).  Musculoskeletal: Positive for arthralgias (hip pain).  Neurological: Positive for headaches.    Past Medical History  Diagnosis Date  . Depression   . Allergy   . Arthritis   . Hyperlipidemia     History   Social History  . Marital Status: Married    Spouse Name: N/A    Number of Children: N/A  . Years of Education: N/A   Occupational History  . Not on file.   Social History Main Topics  . Smoking status: Never Smoker   . Smokeless tobacco: Never Used  . Alcohol Use: Yes     Comment: 3-4 glasses per week  . Drug Use: No  . Sexual Activity: Not on file   Other Topics Concern  . Not on file   Social History Narrative  . No narrative on file    Past Surgical History  Procedure Laterality Date  . Appendectomy    . Abdominal hysterectomy    . Bariatric surgery    . Arm surgery    . Ctr Bilateral 2005    had right hand done 1999  . Tonsillectomy      Triage Vitals: BP 114/74  Pulse 56  Temp(Src) 98.3 F (36.8 C) (Oral)  Resp 16  Ht _0  (1.575 m)  Wt 179 lb 9.6 oz (81.466 kg)  BMI 32.84 kg/m2  SpO2 99%   Objective:    Physical Exam  CONSTITUTIONAL: Well developed/well nourished HEAD: Normocephalic/atraumatic EYES: EOMI/PERRL ENMT: Mucous membranes moist NECK: supple no meningeal signs SPINE:entire spine nontender CV: S1/S2 noted, no murmurs/rubs/gallops noted LUNGS: Lungs are clear to auscultation bilaterally, no apparent distress ABDOMEN: soft, nontender, no rebound or guarding he can feel the left than apparatus in her left midabdomen. She no longer has tenderness in the lower abdomen to GU:no cva tenderness NEURO: Pt is awake/alert, moves all extremitiesx4 EXTREMITIES: pulses normal, full ROM SKIN: warm, color normal PSYCH: no abnormalities of mood noted  Results for orders placed in visit on 09/18/13  POCT CBC      Result Value Range   WBC 7.1  4.6 - 10.2 K/uL   Lymph, poc 1.5  0.6 - 3.4   POC LYMPH PERCENT 21.4  10 - 50 %L   MID (cbc) 0.5  0 - 0.9   POC MID % 6.9  0 - 12 %M   POC Granulocyte 5.1  2 - 6.9   Granulocyte percent 71.7  37 - 80 %G   RBC 4.68  4.04 - 5.48 M/uL   Hemoglobin 14.7  12.2 - 16.2 g/dL   HCT, POC  46.2  37.7 - 47.9 %   MCV 98.7 (*) 80 - 97 fL   MCH, POC 31.4 (*) 27 - 31.2 pg   MCHC 31.8  31.8 - 35.4 g/dL   RDW, POC 13.3     Platelet Count, POC 237  142 - 424 K/uL   MPV 9.7  0 - 99.8 fL  POCT URINALYSIS DIPSTICK      Result Value Range   Color, UA yellow     Clarity, UA clear     Glucose, UA neg     Bilirubin, UA neg     Ketones, UA neg     Spec Grav, UA 1.020     Blood, UA neg     pH, UA 5.5     Protein, UA neg     Urobilinogen, UA 0.2     Nitrite, UA pos     Leukocytes, UA Negative     Renal tubular cells Positive    POCT UA - MICROSCOPIC ONLY      Result Value Range   WBC, Ur, HPF, POC 0-4     RBC, urine, microscopic 2-3     Bacteria, U Microscopic 1+     Mucus, UA Pos     Epithelial cells, urine per micros 10-13     Crystals, Ur, HPF, POC neg     Casts, Ur, LPF, POC neg     Yeast, UA neg     Renal tubular cells Positive     Assessment &  Plan:  Urine is significantly better but she still feels bad. We'll get a urology opinion as to whether she passed a stone that referral has been made. B met was repeated to check on her potassium and renal function. I also advised her to stop the Cipro since this also may be making her feel bad and her cultures negative.

## 2013-09-20 MED ORDER — POTASSIUM CHLORIDE CRYS ER 20 MEQ PO TBCR: 20.0000 meq | EXTENDED_RELEASE_TABLET | Freq: Every day | ORAL | Status: DC

## 2013-09-20 NOTE — Addendum Note (Signed)
Addended by: Constance Goltz on: 09/20/2013 07:27 PM   Modules accepted: Orders

## 2013-10-02 ENCOUNTER — Other Ambulatory Visit: Payer: Self-pay | Admitting: Family Medicine

## 2013-10-04 DIAGNOSIS — R310 Gross hematuria: Secondary | ICD-10-CM | POA: Diagnosis not present

## 2013-10-07 DIAGNOSIS — R310 Gross hematuria: Secondary | ICD-10-CM | POA: Diagnosis not present

## 2013-10-19 DIAGNOSIS — R310 Gross hematuria: Secondary | ICD-10-CM | POA: Diagnosis not present

## 2013-11-03 ENCOUNTER — Other Ambulatory Visit: Payer: Self-pay

## 2013-11-03 DIAGNOSIS — Z1231 Encounter for screening mammogram for malignant neoplasm of breast: Secondary | ICD-10-CM

## 2013-12-01 ENCOUNTER — Ambulatory Visit: Payer: Medicare Other

## 2013-12-14 ENCOUNTER — Other Ambulatory Visit: Payer: Self-pay | Admitting: Emergency Medicine

## 2014-01-12 ENCOUNTER — Other Ambulatory Visit: Payer: Self-pay | Admitting: Emergency Medicine

## 2014-02-14 ENCOUNTER — Ambulatory Visit (INDEPENDENT_AMBULATORY_CARE_PROVIDER_SITE_OTHER): Payer: BC Managed Care – PPO | Admitting: Emergency Medicine

## 2014-02-14 VITALS — BP 124/86 | HR 58 | Temp 98.7°F | Resp 16 | Ht 62.0 in | Wt 178.0 lb

## 2014-02-14 DIAGNOSIS — N3000 Acute cystitis without hematuria: Secondary | ICD-10-CM | POA: Diagnosis not present

## 2014-02-14 DIAGNOSIS — I10 Essential (primary) hypertension: Secondary | ICD-10-CM

## 2014-02-14 DIAGNOSIS — R350 Frequency of micturition: Secondary | ICD-10-CM | POA: Diagnosis not present

## 2014-02-14 LAB — POCT URINALYSIS DIPSTICK
GLUCOSE UA: 100
Nitrite, UA: POSITIVE
PROTEIN UA: 100
UROBILINOGEN UA: 4
pH, UA: 5

## 2014-02-14 LAB — COMPREHENSIVE METABOLIC PANEL
ALBUMIN: 4.2 g/dL (ref 3.5–5.2)
ALK PHOS: 92 U/L (ref 39–117)
ALT: 12 U/L (ref 0–35)
AST: 16 U/L (ref 0–37)
BUN: 22 mg/dL (ref 6–23)
CO2: 24 meq/L (ref 19–32)
Calcium: 10 mg/dL (ref 8.4–10.5)
Chloride: 106 mEq/L (ref 96–112)
Creat: 1.36 mg/dL — ABNORMAL HIGH (ref 0.50–1.10)
Glucose, Bld: 85 mg/dL (ref 70–99)
POTASSIUM: 3.9 meq/L (ref 3.5–5.3)
SODIUM: 140 meq/L (ref 135–145)
TOTAL PROTEIN: 7.1 g/dL (ref 6.0–8.3)
Total Bilirubin: 0.4 mg/dL (ref 0.2–1.2)

## 2014-02-14 LAB — POCT UA - MICROSCOPIC ONLY
Casts, Ur, LPF, POC: NEGATIVE
Crystals, Ur, HPF, POC: NEGATIVE
Mucus, UA: NEGATIVE
YEAST UA: NEGATIVE

## 2014-02-14 MED ORDER — NITROFURANTOIN MONOHYD MACRO 100 MG PO CAPS: 100.0000 mg | ORAL_CAPSULE | Freq: Two times a day (BID) | ORAL | Status: DC

## 2014-02-14 MED ORDER — PHENAZOPYRIDINE HCL 200 MG PO TABS: 200.0000 mg | ORAL_TABLET | Freq: Three times a day (TID) | ORAL | Status: DC | PRN

## 2014-02-14 NOTE — Progress Notes (Signed)
[facility] [address] [place] [address] [phone]  Date:  02/14/2014   Name:  Rebecca Lopez   DOB:  [birth year]   MRN:  [id number]  PCP:  Delman Cheadle, MD    Chief Complaint: Urinary Frequency   History of Present Illness:  Rebecca Lopez is a 68 y.o. very pleasant female patient who presents with the following:  Ill with dysuria, urgency and frequency since yesterday sudden onset.  No fever or chills, nausea or vomiting.  No GYN complaints.  Using AZO with relief.  Frequent past UTI's.  Denies other complaint or health concern today.   Patient Active Problem List   Diagnosis Date Noted  . Family history of malignant neoplasm of gastrointestinal tract 03/10/2013    Past Medical History  Diagnosis Date  . Depression   . Allergy   . Arthritis   . Hyperlipidemia     Past Surgical History  Procedure Laterality Date  . Appendectomy    . Abdominal hysterectomy    . Bariatric surgery    . Arm surgery    . Ctr Bilateral 2005    had right hand done 1999  . Tonsillectomy      History  Substance Use Topics  . Smoking status: Never Smoker   . Smokeless tobacco: Never Used  . Alcohol Use: Yes     Comment: 3-4 glasses per week    Family History  Problem Relation Age of Onset  . Hypertension Mother   . Colon cancer Mother   . Colon polyps Mother   . Heart disease Father   . Cancer Sister   . Colon cancer Sister   . Stroke Maternal Grandfather     No Known Allergies  Medication list has been reviewed and updated.  Current Outpatient Prescriptions on File Prior to Visit  Medication Sig Dispense Refill  . buPROPion (WELLBUTRIN XL) 300 MG 24 hr tablet Take 1 tablet (300 mg total) by mouth daily.  90 tablet  2  . fexofenadine (ALLEGRA) 180 MG tablet TAKE 1 TABLET BY MOUTH EVERY DAY  30 tablet  5  . fluticasone (FLONASE) 50 MCG/ACT nasal spray Place 2 sprays into the nose at bedtime.  16 g  6  . potassium chloride SA (K-DUR,KLOR-CON) 20 MEQ  tablet Take 1 tablet (20 mEq total) by mouth daily. NO MORE REFILLS WITHOUT OFFICE VISIT - OVERDUE FOR RE-CHECK  15 tablet  0  . propranolol ER (INDERAL LA) 80 MG 24 hr capsule Take 1 capsule (80 mg total) by mouth daily.  90 capsule  3  . triamterene-hydrochlorothiazide (DYAZIDE) 37.5-25 MG per capsule Take 1 each (1 capsule total) by mouth every morning.  90 capsule  3   No current facility-administered medications on file prior to visit.    Review of Systems:  As per HPI, otherwise negative.    Physical Examination: Filed Vitals:   02/14/14 0816  BP: 124/86  Pulse: 58  Temp: 98.7 F (37.1 C)  Resp: 16   Filed Vitals:   02/14/14 0816  Height: 5\' 2"  (1.575 m)  Weight: 178 lb (80.74 kg)   Body mass index is 32.55 kg/(m^2). Ideal Body Weight: Weight in (lb) to have BMI = 25: 136.4   GEN: WDWN, NAD, Non-toxic, Alert & Oriented x 3 HEENT: Atraumatic, Normocephalic.  Ears and Nose: No external deformity. EXTR: No clubbing/cyanosis/edema NEURO: Normal gait.  PSYCH: Normally interactive. Conversant. Not depressed or anxious appearing.  Calm demeanor.  Back no  CVA tenderness   Assessment and Plan: Cystitis macrobid Pyridium  Signed,  Ellison Carwin, MD   Results for orders placed in visit on 02/14/14  POCT URINALYSIS DIPSTICK      Result Value Ref Range   Color, UA orange     Clarity, UA clear     Glucose, UA 100     Bilirubin, UA small     Ketones, UA trace     Spec Grav, UA <=1.005     Blood, UA Trace Lysed     pH, UA 5.0     Protein, UA 100     Urobilinogen, UA 4.0     Nitrite, UA pos     Leukocytes, UA large (3+)    POCT UA - MICROSCOPIC ONLY      Result Value Ref Range   WBC, Ur, HPF, POC 10-23     RBC, urine, microscopic 0-3     Bacteria, U Microscopic 4+     Mucus, UA neg     Epithelial cells, urine per micros 0-3     Crystals, Ur, HPF, POC neg     Casts, Ur, LPF, POC neg     Yeast, UA neg

## 2014-02-14 NOTE — Patient Instructions (Signed)
Urinary Tract Infection  Urinary tract infections (UTIs) can develop anywhere along your urinary tract. Your urinary tract is your body's drainage system for removing wastes and extra water. Your urinary tract includes two kidneys, two ureters, a bladder, and a urethra. Your kidneys are a pair of bean-shaped organs. Each kidney is about the size of your fist. They are located below your ribs, one on each side of your spine.  CAUSES  Infections are caused by microbes, which are microscopic organisms, including fungi, viruses, and bacteria. These organisms are so small that they can only be seen through a microscope. Bacteria are the microbes that most commonly cause UTIs.  SYMPTOMS   Symptoms of UTIs may vary by age and gender of the patient and by the location of the infection. Symptoms in young women typically include a frequent and intense urge to urinate and a painful, burning feeling in the bladder or urethra during urination. Older women and men are more likely to be tired, shaky, and weak and have muscle aches and abdominal pain. A fever may mean the infection is in your kidneys. Other symptoms of a kidney infection include pain in your back or sides below the ribs, nausea, and vomiting.  DIAGNOSIS  To diagnose a UTI, your caregiver will ask you about your symptoms. Your caregiver also will ask to provide a urine sample. The urine sample will be tested for bacteria and white blood cells. White blood cells are made by your body to help fight infection.  TREATMENT   Typically, UTIs can be treated with medication. Because most UTIs are caused by a bacterial infection, they usually can be treated with the use of antibiotics. The choice of antibiotic and length of treatment depend on your symptoms and the type of bacteria causing your infection.  HOME CARE INSTRUCTIONS   If you were prescribed antibiotics, take them exactly as your caregiver instructs you. Finish the medication even if you feel better after you  have only taken some of the medication.   Drink enough water and fluids to keep your urine clear or pale yellow.   Avoid caffeine, tea, and carbonated beverages. They tend to irritate your bladder.   Empty your bladder often. Avoid holding urine for long periods of time.   Empty your bladder before and after sexual intercourse.   After a bowel movement, women should cleanse from front to back. Use each tissue only once.  SEEK MEDICAL CARE IF:    You have back pain.   You develop a fever.   Your symptoms do not begin to resolve within 3 days.  SEEK IMMEDIATE MEDICAL CARE IF:    You have severe back pain or lower abdominal pain.   You develop chills.   You have nausea or vomiting.   You have continued burning or discomfort with urination.  MAKE SURE YOU:    Understand these instructions.   Will watch your condition.   Will get help right away if you are not doing well or get worse.  Document Released: 05/29/2005 Document Revised: 02/18/2012 Document Reviewed: 09/27/2011  [employer] Patient Information 2014 [employer]

## 2014-03-02 ENCOUNTER — Other Ambulatory Visit: Payer: Self-pay | Admitting: Emergency Medicine

## 2014-04-27 ENCOUNTER — Ambulatory Visit (INDEPENDENT_AMBULATORY_CARE_PROVIDER_SITE_OTHER): Payer: Medicare Other | Admitting: Emergency Medicine

## 2014-04-27 VITALS — BP 128/82 | HR 57 | Temp 97.4°F | Resp 16 | Ht 62.0 in | Wt 175.3 lb

## 2014-04-27 DIAGNOSIS — R490 Dysphonia: Secondary | ICD-10-CM

## 2014-04-27 MED ORDER — RANITIDINE HCL 150 MG PO TABS: 150.0000 mg | ORAL_TABLET | Freq: Two times a day (BID) | ORAL | Status: DC

## 2014-04-27 NOTE — Patient Instructions (Signed)
Laryngitis °Laryngitis is redness, soreness, and puffiness (inflammation) of the vocal cords. It causes hoarseness, cough, loss of voice, sore throat, and dry throat. It may be caused by: °· Infection. °· Too much smoking. °· Too much talking or yelling. °· Breathing in of toxic fumes. °· Allergies. °· A backup of acid from your stomach.  °HOME CARE °· Drink enough fluids to keep your pee (urine) clear or pale yellow. °· Rest until you no longer have problems or as told by your doctor. °· Breathe in moist air. °· Take all medicine as told by your doctor. °· Do not smoke. °· Talk as little as possible (this includes whispering). °· Write on paper instead of talking until your voice is back to normal. °· Follow up with your doctor if you have not improved after 10 days. °GET HELP IF:  °· You have trouble breathing. °· You cough up blood. °· You have a fever that will not go away. °· You have increasing pain. °· You have trouble swallowing. °MAKE SURE YOU: °· Understand these instructions. °· Will watch your condition. °· Will get help right away if you are not doing well or get worse. °Document Released: 08/08/2011 Document Revised: 11/11/2011 Document Reviewed: 08/08/2011 °[employer]® Patient Information ©2015 [employer]. This information is not intended to replace advice given to you by your health care provider. Make sure you discuss any questions you have with your health care provider. ° °

## 2014-04-27 NOTE — Progress Notes (Signed)
   Subjective:  This chart was scribed for Rebecca Russian, MD by Ladene Artist, ED Scribe. The patient was seen in room 3. Patient's care was started at 1:08 PM.   Patient ID: Rebecca Lopez, female    DOB: November 12, 1945, 68 y.o.   MRN: [id number]  Chief Complaint  Patient presents with  . Voice    Pt. feels that her voice is getting deeper and she is unable to sing and speak as loudly as she once could, no history of a sore throat    HPI HPI Comments: Rebecca Lopez is a 68 y.o. female, with a h/o allergies, who presents to the [facility] complaining of hoarseness over the past 6 weeks. She states that she is unable to speak or sing as loudly as she once could. She reports associated postnasal drip, rhinorrhea and dry cough that she attributes to allergies. She denies sore throat, heartburn, belching. Pt reports an uncomfortable sensation. Pt also reports having a cat as a pet that she is allergic to. Pt has tried [employer] for allergies.   Past Medical History  Diagnosis Date  . Depression   . Allergy   . Arthritis   . Hyperlipidemia    Current Outpatient Prescriptions on File Prior to Visit  Medication Sig Dispense Refill  . buPROPion (WELLBUTRIN XL) 300 MG 24 hr tablet Take 1 tablet (300 mg total) by mouth daily.  90 tablet  2  . fexofenadine (ALLEGRA) 180 MG tablet TAKE 1 TABLET BY MOUTH EVERY DAY  30 tablet  5  . fluticasone (FLONASE) 50 MCG/ACT nasal spray Place 2 sprays into the nose at bedtime.  16 g  6  . nitrofurantoin, macrocrystal-monohydrate, (MACROBID) 100 MG capsule Take 1 capsule (100 mg total) by mouth 2 (two) times daily.  20 capsule  0  . phenazopyridine (PYRIDIUM) 200 MG tablet Take 1 tablet (200 mg total) by mouth 3 (three) times daily as needed for pain.  6 tablet  0  . potassium chloride SA (K-DUR,KLOR-CON) 20 MEQ tablet Take 1 tablet (20 mEq total) by mouth daily.  30 tablet  5  . propranolol ER (INDERAL LA) 80 MG 24 hr capsule Take 1  capsule (80 mg total) by mouth daily.  90 capsule  3  . triamterene-hydrochlorothiazide (DYAZIDE) 37.5-25 MG per capsule Take 1 each (1 capsule total) by mouth every morning.  90 capsule  3   No current facility-administered medications on file prior to visit.   No Known Allergies  Review of Systems  HENT: Positive for postnasal drip, rhinorrhea and voice change. Negative for sore throat.   Respiratory: Positive for cough.   Allergic/Immunologic: Positive for environmental allergies.      Objective:   Physical Exam CONSTITUTIONAL: Well developed/well nourished HEAD: Normocephalic/atraumatic EYES: EOMI/PERRL ENMT: Mucous membranes moist NECK: supple no meningeal signs SPINE:entire spine nontender CV: S1/S2 noted, no murmurs/rubs/gallops noted LUNGS: Lungs are clear to auscultation bilaterally, no apparent distress ABDOMEN: soft, nontender, no rebound or guarding GU:no cva tenderness NEURO: Pt is awake/alert, moves all extremitiesx4 EXTREMITIES: pulses normal, full ROM SKIN: warm, color normal PSYCH: no abnormalities of mood noted    Assessment & Plan:  Will treat with Zantac twice a day. Referral made to ENT to visualize the larynx I personally performed the services described in this documentation, which was scribed in my presence. The recorded information has been reviewed and is accurate.

## 2014-05-04 DIAGNOSIS — R490 Dysphonia: Secondary | ICD-10-CM | POA: Diagnosis not present

## 2014-05-12 ENCOUNTER — Other Ambulatory Visit: Payer: Self-pay | Admitting: Family Medicine

## 2014-06-08 ENCOUNTER — Ambulatory Visit (INDEPENDENT_AMBULATORY_CARE_PROVIDER_SITE_OTHER): Payer: Medicare Other | Admitting: Emergency Medicine

## 2014-06-08 VITALS — BP 120/80 | HR 68 | Temp 98.7°F | Resp 16 | Ht 63.0 in | Wt 168.0 lb

## 2014-06-08 DIAGNOSIS — S0083XA Contusion of other part of head, initial encounter: Secondary | ICD-10-CM

## 2014-06-08 NOTE — Patient Instructions (Signed)

## 2014-06-08 NOTE — Progress Notes (Signed)
[facility] [address], [place] [address] [phone]  Date:  06/08/2014   Name:  Rebecca Lopez   DOB:  08/29/1946   MRN:  [id number]  PCP:  Delman Cheadle, MD    Chief Complaint: Head Injury   History of Present Illness:  Rebecca Lopez is a 68 y.o. very pleasant female patient who presents with the following:  Tripped and fell at a friend's house and struck her head.  Denies LOC or syncope.  Now has a headache that is generalized and pain in the left orbit and forehead. No neuro or visual symptoms.  No nose bleed. No neck pain.  Denies other injury. Not on anticoagulant. No improvement with over the counter medications or other home remedies. . Denies other complaint or health concern today.   Patient Active Problem List   Diagnosis Date Noted  . Family history of malignant neoplasm of gastrointestinal tract 03/10/2013    Past Medical History  Diagnosis Date  . Depression   . Allergy   . Arthritis   . Hyperlipidemia     Past Surgical History  Procedure Laterality Date  . Appendectomy    . Abdominal hysterectomy    . Bariatric surgery    . Arm surgery    . Ctr Bilateral 2005    had right hand done 1999  . Tonsillectomy      History  Substance Use Topics  . Smoking status: Never Smoker   . Smokeless tobacco: Never Used  . Alcohol Use: Yes     Comment: 3-4 glasses per week    Family History  Problem Relation Age of Onset  . Hypertension Mother   . Colon cancer Mother   . Colon polyps Mother   . Heart disease Father   . Cancer Sister   . Colon cancer Sister   . Stroke Maternal Grandfather     No Known Allergies  Medication list has been reviewed and updated.  Current Outpatient Prescriptions on File Prior to Visit  Medication Sig Dispense Refill  . buPROPion (WELLBUTRIN XL) 300 MG 24 hr tablet Take 1 tablet (300 mg total) by mouth daily. PATIENT NEEDS CHECK UP FOR DEPRESSION  FOR ADDITIONAL REFILLS  30 tablet  0  .  fexofenadine (ALLEGRA) 180 MG tablet TAKE 1 TABLET BY MOUTH DAILY  30 tablet  11  . fluticasone (FLONASE) 50 MCG/ACT nasal spray Place 2 sprays into the nose at bedtime.  16 g  6  . potassium chloride SA (K-DUR,KLOR-CON) 20 MEQ tablet Take 1 tablet (20 mEq total) by mouth daily.  30 tablet  5  . propranolol ER (INDERAL LA) 80 MG 24 hr capsule Take 1 capsule (80 mg total) by mouth daily.  90 capsule  3  . triamterene-hydrochlorothiazide (DYAZIDE) 37.5-25 MG per capsule Take 1 each (1 capsule total) by mouth every morning.  90 capsule  3  . ranitidine (ZANTAC) 150 MG tablet Take 1 tablet (150 mg total) by mouth 2 (two) times daily.  60 tablet  3   No current facility-administered medications on file prior to visit.    Review of Systems:  As per HPI, otherwise negative.    Physical Examination: Filed Vitals:   06/08/14 1716  BP: 120/80  Pulse: 68  Temp: 98.7 F (37.1 C)  Resp: 16   Filed Vitals:   06/08/14 1716  Height: 5\' 3"  (1.6 m)  Weight: 168 lb (76.204 kg)   Body mass index is 29.77 kg/(m^2). Ideal  Body Weight: Weight in (lb) to have BMI = 25: 140.8   GEN: WDWN, NAD, Non-toxic, Alert & Oriented x 3 HEENT: bruising left forehead and malar arch.  Tender no deformity, Normocephalic.  Ears and Nose: No external deformity.  Abrasion nose.  No septal hematoma.  No deformity EXTR: No clubbing/cyanosis/edema NEURO: Normal gait. PRRERLA EOMI CN 2-12 intact PSYCH: Normally interactive. Conversant. Not depressed or anxious appearing.  Calm demeanor.    Assessment and Plan: Closed head injury  Facial contusion Follow up as needed Refused xrays   Signed,  Ellison Carwin, MD

## 2014-06-15 ENCOUNTER — Other Ambulatory Visit: Payer: Self-pay | Admitting: Family Medicine

## 2014-06-15 ENCOUNTER — Other Ambulatory Visit: Payer: Self-pay | Admitting: Emergency Medicine

## 2014-06-15 NOTE — Telephone Encounter (Signed)
Please call this patient.  I have refilled the Maxide, however:  Is she still taking the potassium supplement along with this medication?  If so, advise repeat labwork to assess, since the diuretic she takes can further increase the potassium level.  Also, in June there was a mild elevation of her kidney function tests (Creatinine), and that should be re-checked as well.

## 2014-06-20 NOTE — Telephone Encounter (Signed)
LM for pt to RTC and rtn call.

## 2014-07-07 ENCOUNTER — Other Ambulatory Visit: Payer: Self-pay | Admitting: Family Medicine

## 2014-07-15 ENCOUNTER — Other Ambulatory Visit: Payer: Self-pay | Admitting: Emergency Medicine

## 2014-07-15 NOTE — Telephone Encounter (Signed)
Spoke with Glennis going to refill prescription for 1 month and she states she will be booking a follow up for additional refills

## 2014-08-12 ENCOUNTER — Other Ambulatory Visit: Payer: Self-pay | Admitting: Emergency Medicine

## 2014-08-16 ENCOUNTER — Ambulatory Visit (INDEPENDENT_AMBULATORY_CARE_PROVIDER_SITE_OTHER): Payer: Medicare Other | Admitting: Family Medicine

## 2014-08-16 ENCOUNTER — Telehealth: Payer: Self-pay | Admitting: Family Medicine

## 2014-08-16 VITALS — BP 122/80 | HR 73 | Temp 98.1°F | Resp 16 | Ht 62.0 in | Wt 173.0 lb

## 2014-08-16 DIAGNOSIS — Z23 Encounter for immunization: Secondary | ICD-10-CM | POA: Diagnosis not present

## 2014-08-16 DIAGNOSIS — I10 Essential (primary) hypertension: Secondary | ICD-10-CM

## 2014-08-16 DIAGNOSIS — E876 Hypokalemia: Secondary | ICD-10-CM

## 2014-08-16 DIAGNOSIS — N179 Acute kidney failure, unspecified: Secondary | ICD-10-CM

## 2014-08-16 DIAGNOSIS — Z5189 Encounter for other specified aftercare: Secondary | ICD-10-CM

## 2014-08-16 DIAGNOSIS — F329 Major depressive disorder, single episode, unspecified: Secondary | ICD-10-CM

## 2014-08-16 DIAGNOSIS — F32A Depression, unspecified: Secondary | ICD-10-CM

## 2014-08-16 LAB — BASIC METABOLIC PANEL WITH GFR
BUN: 18 mg/dL (ref 6–23)
CALCIUM: 9.8 mg/dL (ref 8.4–10.5)
CO2: 27 mEq/L (ref 19–32)
CREATININE: 1.13 mg/dL — AB (ref 0.50–1.10)
Chloride: 105 mEq/L (ref 96–112)
GFR, EST AFRICAN AMERICAN: 59 mL/min — AB
GFR, EST NON AFRICAN AMERICAN: 51 mL/min — AB
GLUCOSE: 71 mg/dL (ref 70–99)
Potassium: 4.5 mEq/L (ref 3.5–5.3)
Sodium: 141 mEq/L (ref 135–145)

## 2014-08-16 MED ORDER — TRIAMTERENE-HCTZ 37.5-25 MG PO TABS
1.0000 | ORAL_TABLET | Freq: Every morning | ORAL | Status: DC
Start: 2014-08-16 — End: 2014-09-30

## 2014-08-16 MED ORDER — CITALOPRAM HYDROBROMIDE 20 MG PO TABS: 20.0000 mg | ORAL_TABLET | Freq: Every day | ORAL | Status: DC

## 2014-08-16 MED ORDER — FLUTICASONE PROPIONATE 50 MCG/ACT NA SUSP: 2.0000 | Freq: Every day | NASAL | Status: DC

## 2014-08-16 MED ORDER — PROPRANOLOL HCL ER 80 MG PO CP24: 80.0000 mg | ORAL_CAPSULE | Freq: Every day | ORAL | Status: DC

## 2014-08-16 MED ORDER — CETIRIZINE HCL 10 MG PO TABS: 10.0000 mg | ORAL_TABLET | Freq: Every day | ORAL | Status: DC

## 2014-08-16 NOTE — Patient Instructions (Addendum)
You were on citalopram 20mg  daily previously so lets start you on 1/2 tab of the citalopram along with your wellbutrin then after 1 week stop the wellbutrin and continue just on 1/2 tab of the citalopram for the second week.  If you are doing ok at that time - things seem stable and you are not having any significant side effects then go up to a whole tab of the citalopram a day.  Remember it takes 6 weeks at the same dose to see full effect so around 2/15 is when you will have an accurate picture of how you are doing on the citalopram 20.  If this is not working for you or you are having any problems, don't hesitate to let me know or come back sooner.   Potassium Content of Foods Potassium is a mineral found in many foods and drinks. It helps keep fluids and minerals balanced in your body and affects how steadily your heart beats. Potassium also helps control your blood pressure and keep your muscles and nervous system healthy. Certain health conditions and medicines may change the balance of potassium in your body. When this happens, you can help balance your level of potassium through the foods that you do or do not eat. Your health care provider or dietitian may recommend an amount of potassium that you should have each day. The following lists of foods provide the amount of potassium (in parentheses) per serving in each item. HIGH IN POTASSIUM  The following foods and beverages have 200 mg or more of potassium per serving:  Apricots, 2 raw or 5 dry (200 mg).  Artichoke, 1 medium (345 mg).  Avocado, raw,  each (245 mg).  Banana, 1 medium (425 mg).  Beans, lima, or baked beans, canned,  cup (280 mg).  Beans, white, canned,  cup (595 mg).  Beef roast, 3 oz (320 mg).  Beef, ground, 3 oz (270 mg).  Beets, raw or cooked,  cup (260 mg).  Bran muffin, 2 oz (300 mg).  Broccoli,  cup (230 mg).  Brussels sprouts,  cup (250 mg).  Cantaloupe,  cup (215 mg).  Cereal, 100% bran,   cup (200-400 mg).  Cheeseburger, single, fast food, 1 each (225-400 mg).  Chicken, 3 oz (220 mg).  Clams, canned, 3 oz (535 mg).  Crab, 3 oz (225 mg).  Dates, 5 each (270 mg).  Dried beans and peas,  cup (300-475 mg).  Figs, dried, 2 each (260 mg).  Fish: halibut, tuna, cod, snapper, 3 oz (480 mg).  Fish: salmon, haddock, swordfish, perch, 3 oz (300 mg).  Fish, tuna, canned 3 oz (200 mg).  [place] fries, fast food, 3 oz (470 mg).  Granola with fruit and nuts,  cup (200 mg).  Grapefruit juice,  cup (200 mg).  Greens, beet,  cup (655 mg).  Honeydew melon,  cup (200 mg).  Kale, raw, 1 cup (300 mg).  Kiwi, 1 medium (240 mg).  Kohlrabi, rutabaga, parsnips,  cup (280 mg).  Lentils,  cup (365 mg).  Mango, 1 each (325 mg).  Milk, chocolate, 1 cup (420 mg).  Milk: nonfat, low-fat, whole, buttermilk, 1 cup (350-380 mg).  Molasses, 1 Tbsp (295 mg).  Mushrooms,  cup (280) mg.  Nectarine, 1 each (275 mg).  Nuts: almonds, peanuts, hazelnuts, [place], cashew, mixed, 1 oz (200 mg).  Nuts, pistachios, 1 oz (295 mg).  Orange, 1 each (240 mg).  Orange juice,  cup (235 mg).  Papaya, medium,  fruit (390 mg).  Peanut butter, chunky, 2 Tbsp (240 mg).  Peanut butter, smooth, 2 Tbsp (210 mg).  Pear, 1 medium (200 mg).  Pomegranate, 1 whole (400 mg).  Pomegranate juice,  cup (215 mg).  Pork, 3 oz (350 mg).  Potato chips, salted, 1 oz (465 mg).  Potato, baked with skin, 1 medium (925 mg).  Potatoes, boiled,  cup (255 mg).  Potatoes, mashed,  cup (330 mg).  Prune juice,  cup (370 mg).  Prunes, 5 each (305 mg).  Pudding, chocolate,  cup (230 mg).  Pumpkin, canned,  cup (250 mg).  Raisins, seedless,  cup (270 mg).  Seeds, sunflower or pumpkin, 1 oz (240 mg).  Soy milk, 1 cup (300 mg).  Spinach,  cup (420 mg).  Spinach, canned,  cup (370 mg).  Sweet potato, baked with skin, 1 medium (450 mg).  Swiss chard,  cup (480  mg).  Tomato or vegetable juice,  cup (275 mg).  Tomato sauce or puree,  cup (400-550 mg).  Tomato, raw, 1 medium (290 mg).  Tomatoes, canned,  cup (200-300 mg).  [place], 3 oz (250 mg).  Wheat germ, 1 oz (250 mg).  Winter squash,  cup (250 mg).  Yogurt, plain or fruited, 6 oz (260-435 mg).  Zucchini,  cup (220 mg). MODERATE IN POTASSIUM The following foods and beverages have 50-200 mg of potassium per serving:  Apple, 1 each (150 mg).  Apple juice,  cup (150 mg).  Applesauce,  cup (90 mg).  Apricot nectar,  cup (140 mg).  Asparagus, small spears,  cup or 6 spears (155 mg).  Bagel, cinnamon raisin, 1 each (130 mg).  Bagel, egg or plain, 4 in., 1 each (70 mg).  Beans, green,  cup (90 mg).  Beans, yellow,  cup (190 mg).  Beer, regular, 12 oz (100 mg).  Beets, canned,  cup (125 mg).  Blackberries,  cup (115 mg).  Blueberries,  cup (60 mg).  Bread, whole wheat, 1 slice (70 mg).  Broccoli, raw,  cup (145 mg).  Cabbage,  cup (150 mg).  Carrots, cooked or raw,  cup (180 mg).  Cauliflower, raw,  cup (150 mg).  Celery, raw,  cup (155 mg).  Cereal, bran flakes, cup (120-150 mg).  Cheese, cottage,  cup (110 mg).  Cherries, 10 each (150 mg).  Chocolate, 1 oz bar (165 mg).  Coffee, brewed 6 oz (90 mg).  Corn,  cup or 1 ear (195 mg).  Cucumbers,  cup (80 mg).  Egg, large, 1 each (60 mg).  Eggplant,  cup (60 mg).  Endive, raw, cup (80 mg).  English muffin, 1 each (65 mg).  Fish, orange roughy, 3 oz (150 mg).  Frankfurter, beef or pork, 1 each (75 mg).  Fruit cocktail,  cup (115 mg).  Grape juice,  cup (170 mg).  Grapefruit,  fruit (175 mg).  Grapes,  cup (155 mg).  Greens: kale, turnip, collard,  cup (110-150 mg).  Ice cream or frozen yogurt, chocolate,  cup (175 mg).  Ice cream or frozen yogurt, vanilla,  cup (120-150 mg).  Lemons, limes, 1 each (80 mg).  Lettuce, all types, 1 cup (100  mg).  Mixed vegetables,  cup (150 mg).  Mushrooms, raw,  cup (110 mg).  Nuts: walnuts, pecans, or macadamia, 1 oz (125 mg).  Oatmeal,  cup (80 mg).  Okra,  cup (110 mg).  Onions, raw,  cup (120 mg).  Peach, 1 each (185 mg).  Peaches, canned,  cup (120 mg).  Pears, canned,  cup (  120 mg).  Peas, green, frozen,  cup (90 mg).  Peppers, green,  cup (130 mg).  Peppers, red,  cup (160 mg).  Pineapple juice,  cup (165 mg).  Pineapple, fresh or canned,  cup (100 mg).  Plums, 1 each (105 mg).  Pudding, vanilla,  cup (150 mg).  Raspberries,  cup (90 mg).  Rhubarb,  cup (115 mg).  Rice, wild,  cup (80 mg).  Shrimp, 3 oz (155 mg).  Spinach, raw, 1 cup (170 mg).  Strawberries,  cup (125 mg).  Summer squash  cup (175-200 mg).  Swiss chard, raw, 1 cup (135 mg).  Tangerines, 1 each (140 mg).  Tea, brewed, 6 oz (65 mg).  Turnips,  cup (140 mg).  Watermelon,  cup (85 mg).  Wine, red, table, 5 oz (180 mg).  Wine, white, table, 5 oz (100 mg). LOW IN POTASSIUM The following foods and beverages have less than 50 mg of potassium per serving.  Bread, white, 1 slice (30 mg).  Carbonated beverages, 12 oz (less than 5 mg).  Cheese, 1 oz (20-30 mg).  Cranberries,  cup (45 mg).  Cranberry juice cocktail,  cup (20 mg).  Fats and oils, 1 Tbsp (less than 5 mg).  Hummus, 1 Tbsp (32 mg).  Nectar: papaya, mango, or pear,  cup (35 mg).  Rice, white or brown,  cup (50 mg).  Spaghetti or macaroni,  cup cooked (30 mg).  Tortilla, flour or corn, 1 each (50 mg).  Waffle, 4 in., 1 each (50 mg).  Water chestnuts,  cup (40 mg). Document Released: 04/02/2005 Document Revised: 08/24/2013 Document Reviewed: 07/16/2013 [facility] Patient Information 2015 [place], [place]. This information is not intended to replace advice given to you by your health care provider. Make sure you discuss any questions you have with your health care provider.  Acute  Kidney Injury Acute kidney injury is a disease in which there is sudden (acute) damage to the kidneys. The kidneys are 2 organs that lie on either side of the spine between the middle of the back and the front of the abdomen. The kidneys:  Remove wastes and extra water from the blood.   Produce important hormones. These help keep bones strong, regulate blood pressure, and help create red blood cells.   Balance the fluids and chemicals in the blood and tissues. A small amount of kidney damage may not cause problems, but a large amount of damage may make it difficult or impossible for the kidneys to work the way they should. Acute kidney injury may develop into long-lasting (chronic) kidney disease. It may also develop into a life-threatening disease called end-stage kidney disease. Acute kidney injury can get worse very quickly, so it should be treated right away. Early treatment may prevent other kidney diseases from developing.  CAUSES   A problem with blood flow to the kidneys. This may be caused by:   Blood loss.   Heart disease.   Severe burns.   Liver disease.  Direct damage to the kidneys. This may be caused by:  Some medicines.   A kidney infection.   Poisoning or consuming toxic substances.   A surgical wound.   A blow to the kidney area.   A problem with urine flow. This may be caused by:   Cancer.   Kidney stones.   An enlarged prostate. SYMPTOMS   Swelling (edema) of the legs, ankles, or feet.   Tiredness (lethargy).   Nausea or vomiting.   Confusion.   Problems with  urination, such as:   Painful or burning feeling during urination.   Decreased urine production.   Frequent accidents in children who are potty trained.   Bloody urine.   Muscle twitches and cramps.   Shortness of breath.   Seizures.   Chest pain or pressure. Sometimes, no symptoms are present. DIAGNOSIS Acute kidney injury may be detected and  diagnosed by tests, including blood, urine, imaging, or kidney biopsy tests.  TREATMENT Treatment of acute kidney injury varies depending on the cause and severity of the kidney damage. In mild cases, no treatment may be needed. The kidneys may heal on their own. If acute kidney injury is more severe, your caregiver will treat the cause of the kidney damage, help the kidneys heal, and prevent complications from occurring. Severe cases may require a procedure to remove toxic wastes from the body (dialysis) or surgery to repair kidney damage. Surgery may involve:   Repair of a torn kidney.   Removal of an obstruction. Most of the time, you will need to stay overnight at the hospital.  HOME CARE INSTRUCTIONS:  Follow your prescribed diet.  Only take over-the-counter or prescription medicines as directed by your caregiver.  Do not take any new medicines (prescription, over-the-counter, or nutritional supplements) unless approved by your caregiver. Many medicines can worsen your kidney damage or need to have the dose adjusted.   Keep all follow-up appointments as directed by your caregiver.  Observe your condition to make sure you are healing as expected. SEEK IMMEDIATE MEDICAL CARE IF:  You are feeling ill or have severe pain in the back or side.   Your symptoms return or you have new symptoms.  You have any symptoms of end-stage kidney disease. These include:   Persistent itchiness.   Loss of appetite.   Headaches.   Abnormally dark or light skin.  Numbness in the hands or feet.   Easy bruising.   Frequent hiccups.   Menstruation stops.   You have a fever.  You have increased urine production.  You have pain or bleeding when urinating. MAKE SURE YOU:   Understand these instructions.  Will watch your condition.  Will get help right away if you are not doing well or get worse Document Released: 03/04/2011 Document Revised: 12/14/2012 Document Reviewed:  04/17/2012 [facility] Patient Information 2015 [place], [place]. This information is not intended to replace advice given to you by your health care provider. Make sure you discuss any questions you have with your health care provider.

## 2014-08-16 NOTE — Progress Notes (Signed)
Subjective:  This chart was scribed for Rebecca Cheadle, MD by Rebecca Lopez, Medical Scribe. This patient was seen in Room 12 and the patient's care was started at 9:08 AM.   Patient ID: Rebecca Lopez, female    DOB: Jun 27, 1946, 68 y.o.   MRN: [id number]  Chief Complaint  Patient presents with  . Depression    Medication review  . Flu Vaccine  . Labs Only    Potassium    HPI HPI Comments: Rebecca Lopez is a 68 y.o. female who presents to the [facility] complaining of irritability that started 6 months ago.  She states that this is the angriest that she has ever been in her life and she recently hit her dog out of frustration.  She is also not as active as she once was.  She recently quit her job and moved in with her daughter.  She lists sleep disturbance as an associated symptom but states that it is relieved with Molarium.    I last saw patient 15 months ago.  She had been on Celexa for 15 years and then Wellbutrin was added on when her daughter passed away.  After four years, she felt that she was taking too much medication and so weaned off of her Celexa and was doing well on her Wellbutrin.  Last year, she also had some hypokalemia down to 3.2.  The hypokalemia was likely caused by her blood pressure medication.  She would like blood work to recheck her potassium levels.  She has been taking potassium supplements 5/7 days of the week.  She is also complaining of a knot over her left eye that started 10 weeks ago after she fell on her face and her glasses pushed into her skin.  She is still complaining of allergies and has been taking Flonase and Allegra.  She has not tried Zyrtec.    She lists mild dizziness and lightheadedness when she stands up but states that it does not bother her.  She denies chest pain, SOB, and palpitations as associated symptoms.  She does not check her blood pressure at home.     Past Medical History  Diagnosis Date  . Depression   . Allergy    . Arthritis   . Hyperlipidemia    Past Surgical History  Procedure Laterality Date  . Appendectomy    . Abdominal hysterectomy    . Bariatric surgery    . Arm surgery    . Ctr Bilateral 2005    had right hand done 1999  . Tonsillectomy     Family History  Problem Relation Age of Onset  . Hypertension Mother   . Colon cancer Mother   . Colon polyps Mother   . Heart disease Father   . Cancer Sister   . Colon cancer Sister   . Stroke Maternal Grandfather    History   Social History  . Marital Status: Married    Spouse Name: N/A    Number of Children: N/A  . Years of Education: N/A   Occupational History  . Not on file.   Social History Main Topics  . Smoking status: Never Smoker   . Smokeless tobacco: Never Used  . Alcohol Use: Yes     Comment: 3-4 glasses per week  . Drug Use: No  . Sexual Activity: Not on file   Other Topics Concern  . Not on file   Social History Narrative   No Known Allergies  Review of Systems  Constitutional: Positive for activity change.  Respiratory: Negative for shortness of breath.   Cardiovascular: Negative for chest pain and palpitations.  Neurological: Positive for dizziness and light-headedness.  Psychiatric/Behavioral: Positive for sleep disturbance and agitation.     Objective:  BP 122/80 mmHg  Pulse 73  Temp(Src) 98.1 F (36.7 C) (Oral)  Resp 16  Ht 5\' 2"  (1.575 m)  Wt 173 lb (78.472 kg)  BMI 31.63 kg/m2  SpO2 97%  Physical Exam  Constitutional: She is oriented to person, place, and time. She appears well-developed and well-nourished.  HENT:  Head: Normocephalic and atraumatic.  Eyes: EOM are normal.  Neck: Normal range of motion. Neck supple. No thyromegaly present.  Cardiovascular: Normal rate, regular rhythm, S1 normal, S2 normal and normal heart sounds.   No murmur heard. Pulmonary/Chest: Effort normal and breath sounds normal. No respiratory distress. She has no wheezes. She has no rales. She exhibits  no tenderness.  Musculoskeletal: Normal range of motion.  Lymphadenopathy:    She has no cervical adenopathy.  Neurological: She is alert and oriented to person, place, and time.  Skin: Skin is warm and dry.  Psychiatric: She has a normal mood and affect. Her behavior is normal.  Nursing note and vitals reviewed.   Assessment & Plan:    Advised patient to use Vitamin E or Mederma on recent forehead injury w/ pressure massage to get scar tissue to lay down flat.    Will try Zyrtec instead of allegra for patient's cat allergies to her indoor cat.    Flu vaccine need - Plan: Flu Vaccine QUAD 36+ mos IM  Essential hypertension, benign - Plan: BASIC METABOLIC PANEL WITH GFR - well controlled, meds refilled.  Encounter for medication adjustment - Plan: BASIC METABOLIC PANEL WITH GFR  Hypokalemia - Plan: BASIC METABOLIC PANEL WITH GFR - Will recheck potassium levels - level good so try to increase K in diet but does not need K supp currently.   - from maxzide.  Acute renal failure, unspecified acute renal failure type - One year ago patient's creatinine was normal at 0.91 with a GFR of 66 but during the recurrent UTIs her creatinine increased and was last 1.36 with  GFR of 40.  On today's labs Cr 1.2 closer to baseline with GFR at 52.    Depression - irritable on wellbutrin but did well on citalopram prev so start 1/2 tab qd x 1 wk then stop wellbutrin. After 2nd wk can increase to whole tab citalopram.  Slow cross-taper if side effects. F/u in 2 months when citalopram 20 is at max effect. Advised patient of Citalopram side effects and advised patient that should the side effects persist, Lexipro will be started.  Meds ordered this encounter  Medications  . citalopram (CELEXA) 20 MG tablet    Sig: Take 1 tablet (20 mg total) by mouth daily.    Dispense:  90 tablet    Refill:  1  . cetirizine (ZYRTEC) 10 MG tablet    Sig: Take 1 tablet (10 mg total) by mouth daily.    Dispense:  30 tablet      Refill:  11  . fluticasone (FLONASE) 50 MCG/ACT nasal spray    Sig: Place 2 sprays into both nostrils at bedtime.    Dispense:  16 g    Refill:  11  . propranolol ER (INDERAL LA) 80 MG 24 hr capsule    Sig: Take 1 capsule (80 mg total) by mouth daily.  Dispense:  90 capsule    Refill:  3  . triamterene-hydrochlorothiazide (MAXZIDE-25) 37.5-25 MG per tablet    Sig: Take 1 tablet by mouth every morning.    Dispense:  90 tablet    Refill:  3    I personally performed the services described in this documentation, which was scribed in my presence. The recorded information has been reviewed and considered, and addended by me as needed.  Rebecca Cheadle, MD MPH  Results for orders placed or performed in visit on 97/98/92  BASIC METABOLIC PANEL WITH GFR  Result Value Ref Range   Sodium 141 135 - 145 mEq/L   Potassium 4.5 3.5 - 5.3 mEq/L   Chloride 105 96 - 112 mEq/L   CO2 27 19 - 32 mEq/L   Glucose, Bld 71 70 - 99 mg/dL   BUN 18 6 - 23 mg/dL   Creat 1.13 (H) 0.50 - 1.10 mg/dL   Calcium 9.8 8.4 - 10.5 mg/dL   GFR, Est African American 59 (L) mL/min   GFR, Est Non African American 51 (L) mL/min

## 2014-08-16 NOTE — Telephone Encounter (Signed)
Patient had flu shot on 08/16/14.

## 2014-08-17 ENCOUNTER — Encounter: Payer: Self-pay | Admitting: Family Medicine

## 2014-08-22 ENCOUNTER — Other Ambulatory Visit: Payer: Self-pay | Admitting: Physician Assistant

## 2014-09-27 ENCOUNTER — Ambulatory Visit (INDEPENDENT_AMBULATORY_CARE_PROVIDER_SITE_OTHER): Payer: Medicare Other | Admitting: Family Medicine

## 2014-09-27 VITALS — BP 126/78 | HR 60 | Temp 98.6°F | Resp 16 | Ht 62.0 in | Wt 171.0 lb

## 2014-09-27 DIAGNOSIS — Z130 Encounter for screening for diseases of the blood and blood-forming organs and certain disorders involving the immune mechanism: Secondary | ICD-10-CM

## 2014-09-27 DIAGNOSIS — R899 Unspecified abnormal finding in specimens from other organs, systems and tissues: Secondary | ICD-10-CM | POA: Diagnosis not present

## 2014-09-27 DIAGNOSIS — I10 Essential (primary) hypertension: Secondary | ICD-10-CM

## 2014-09-27 DIAGNOSIS — E785 Hyperlipidemia, unspecified: Secondary | ICD-10-CM | POA: Diagnosis not present

## 2014-09-27 DIAGNOSIS — M949 Disorder of cartilage, unspecified: Secondary | ICD-10-CM | POA: Diagnosis not present

## 2014-09-27 DIAGNOSIS — E569 Vitamin deficiency, unspecified: Secondary | ICD-10-CM | POA: Diagnosis not present

## 2014-09-27 DIAGNOSIS — Z1321 Encounter for screening for nutritional disorder: Secondary | ICD-10-CM | POA: Diagnosis not present

## 2014-09-27 DIAGNOSIS — E559 Vitamin D deficiency, unspecified: Secondary | ICD-10-CM | POA: Diagnosis not present

## 2014-09-27 DIAGNOSIS — M899 Disorder of bone, unspecified: Secondary | ICD-10-CM

## 2014-09-27 DIAGNOSIS — R5383 Other fatigue: Secondary | ICD-10-CM | POA: Diagnosis not present

## 2014-09-27 LAB — POCT URINALYSIS DIPSTICK
Glucose, UA: NEGATIVE
Ketones, UA: NEGATIVE
Nitrite, UA: NEGATIVE
SPEC GRAV UA: 1.02
Urobilinogen, UA: 0.2
pH, UA: 5.5

## 2014-09-27 LAB — TSH: TSH: 2.223 u[IU]/mL (ref 0.350–4.500)

## 2014-09-27 LAB — CBC WITH DIFFERENTIAL/PLATELET
Basophils Absolute: 0.1 10*3/uL (ref 0.0–0.1)
Basophils Relative: 1 % (ref 0–1)
EOS ABS: 0.1 10*3/uL (ref 0.0–0.7)
EOS PCT: 1 % (ref 0–5)
HCT: 42 % (ref 36.0–46.0)
Hemoglobin: 14.6 g/dL (ref 12.0–15.0)
LYMPHS ABS: 1.4 10*3/uL (ref 0.7–4.0)
Lymphocytes Relative: 21 % (ref 12–46)
MCH: 31.5 pg (ref 26.0–34.0)
MCHC: 34.8 g/dL (ref 30.0–36.0)
MCV: 90.7 fL (ref 78.0–100.0)
MPV: 10 fL (ref 8.6–12.4)
Monocytes Absolute: 0.6 10*3/uL (ref 0.1–1.0)
Monocytes Relative: 9 % (ref 3–12)
NEUTROS ABS: 4.7 10*3/uL (ref 1.7–7.7)
Neutrophils Relative %: 68 % (ref 43–77)
PLATELETS: 230 10*3/uL (ref 150–400)
RBC: 4.63 MIL/uL (ref 3.87–5.11)
RDW: 13.9 % (ref 11.5–15.5)
WBC: 6.9 10*3/uL (ref 4.0–10.5)

## 2014-09-27 LAB — BASIC METABOLIC PANEL
BUN: 20 mg/dL (ref 6–23)
CALCIUM: 9.9 mg/dL (ref 8.4–10.5)
CO2: 26 mEq/L (ref 19–32)
Chloride: 103 mEq/L (ref 96–112)
Creat: 0.97 mg/dL (ref 0.50–1.10)
Glucose, Bld: 82 mg/dL (ref 70–99)
Potassium: 3.5 mEq/L (ref 3.5–5.3)
SODIUM: 139 meq/L (ref 135–145)

## 2014-09-27 LAB — VITAMIN B12: Vitamin B-12: 411 pg/mL (ref 211–911)

## 2014-09-27 LAB — LIPID PANEL
CHOLESTEROL: 277 mg/dL — AB (ref 0–200)
HDL: 55 mg/dL (ref >39)
LDL CALC: 199 mg/dL — AB (ref 0–99)
Total CHOL/HDL Ratio: 5 Ratio
Triglycerides: 115 mg/dL (ref <150)
VLDL: 23 mg/dL (ref 0–40)

## 2014-09-27 LAB — POCT UA - MICROSCOPIC ONLY
Bacteria, U Microscopic: NEGATIVE
Casts, Ur, LPF, POC: NEGATIVE
Crystals, Ur, HPF, POC: NEGATIVE
Mucus, UA: NEGATIVE
YEAST UA: NEGATIVE

## 2014-09-27 NOTE — Progress Notes (Signed)
Subjective:  This chart was scribed for Delman Cheadle, MD by Randa Evens, ED Scribe. This Patient was seen in room 08 and the patients care was started at 8:52 AM   Patient ID: Rebecca Lopez, female    DOB: [birth year], 69 y.o.   MRN: [id number]  Chief Complaint  Patient presents with  . Fatigue    x 1 month     HPI HPI Comments: Rebecca Lopez is a 69 y.o. female who presents to the [facility] complaining of fatigue onset 1 month prior described as having no energy. Pt states that she thinks this is due to having a low potassium. Pt states she has associated cramping in calf's. Pt states she had some left over potassium and the next day she felt more energetic. Pt states she has also been having recurrent gradual headaches but is also not sure if this is due to her potassium levels. Denies any dietary supplements. Denies any muscle soreness. Denies any urinary symptoms.  Pt states she is not sure how she is doing after changing medications due to her recently feeling more fatigued. Pt denies any side effects from the citalopram.     Past Medical History  Diagnosis Date  . Depression   . Allergy   . Arthritis   . Hyperlipidemia    Current Outpatient Prescriptions on File Prior to Visit  Medication Sig Dispense Refill  . cetirizine (ZYRTEC) 10 MG tablet Take 1 tablet (10 mg total) by mouth daily. 30 tablet 11  . citalopram (CELEXA) 20 MG tablet Take 1 tablet (20 mg total) by mouth daily. 90 tablet 1  . fluticasone (FLONASE) 50 MCG/ACT nasal spray Place 2 sprays into both nostrils at bedtime. 16 g 11  . propranolol ER (INDERAL LA) 80 MG 24 hr capsule Take 1 capsule (80 mg total) by mouth daily. 90 capsule 3  . triamterene-hydrochlorothiazide (MAXZIDE-25) 37.5-25 MG per tablet Take 1 tablet by mouth every morning. 90 tablet 3  . potassium chloride SA (K-DUR,KLOR-CON) 20 MEQ tablet Take 1 tablet (20 mEq total) by mouth daily. (Patient not taking: Reported on 09/27/2014)  30 tablet 5   No current facility-administered medications on file prior to visit.   No Known Allergies   Review of Systems  Constitutional: Positive for fatigue.  Genitourinary: Negative.   Musculoskeletal: Positive for myalgias.  Neurological: Positive for headaches.     Objective:   BP 126/78 mmHg  Pulse 60  Temp(Src) 98.6 F (37 C) (Oral)  Resp 16  Ht 5\' 2"  (1.575 m)  Wt 171 lb (77.565 kg)  BMI 31.27 kg/m2  SpO2 98%   Physical Exam  Constitutional: She is oriented to person, place, and time. She appears well-developed and well-nourished. No distress.  HENT:  Head: Normocephalic and atraumatic.  Eyes: Conjunctivae and EOM are normal.  Neck: Neck supple. No tracheal deviation present.  Cardiovascular: Normal rate, regular rhythm, S1 normal, S2 normal and normal heart sounds.   No murmur heard. Pulmonary/Chest: Effort normal and breath sounds normal. No respiratory distress.  Musculoskeletal: Normal range of motion.  Lymphadenopathy:    She has no cervical adenopathy.  Neurological: She is alert and oriented to person, place, and time.  Skin: Skin is warm and dry.  Psychiatric: She has a normal mood and affect. Her behavior is normal.  Nursing note and vitals reviewed.   Assessment & Plan:     Other fatigue - Plan: CBC with Differential/Platelet, Lipid panel, TSH, Basic metabolic  panel, Vitamin B12, Vit D  25 hydroxy (rtn osteoporosis monitoring), POCT UA - Microscopic Only, POCT urinalysis dipstick - If no lab etiology  is found to explain fatigue symptoms consider restarting low dose of qam Wellbutrin.   Hyperlipidemia - Plan: Lipid panel, Vitamin B12, Vit D  25 hydroxy (rtn osteoporosis monitoring)  Essential hypertension - Plan: Lipid panel, Vitamin B12, Vit D  25 hydroxy (rtn osteoporosis monitoring)  Abnormal laboratory test result - Plan: Vitamin B12, Vit D  25 hydroxy (rtn osteoporosis monitoring)  Screening for deficiency anemia - Plan: CBC with  Differential/Platelet, Vitamin B12, Vit D  25 hydroxy (rtn osteoporosis monitoring)  Encounter for vitamin deficiency screening - Plan: Vitamin B12, Vit D  25 hydroxy (rtn osteoporosis monitoring)  Disorder of bone and cartilage - Plan: Vitamin B12, Vit D  25 hydroxy (rtn osteoporosis monitoring)  Multiple vitamin deficiency - Plan: Vitamin B12, Vit D  25 hydroxy (rtn osteoporosis monitoring)  Vitamin D deficiency  Meds ordered this encounter  Medications  . ergocalciferol (VITAMIN D2) 50000 UNITS capsule    Sig: Take 1 capsule (50,000 Units total) by mouth once a week.    Dispense:  12 capsule    Refill:  2    I personally performed the services described in this documentation, which was scribed in my presence. The recorded information has been reviewed and considered, and addended by me as needed.  Delman Cheadle, MD MPH

## 2014-09-28 ENCOUNTER — Other Ambulatory Visit: Payer: Self-pay | Admitting: Physician Assistant

## 2014-09-28 ENCOUNTER — Other Ambulatory Visit: Payer: Self-pay | Admitting: Family Medicine

## 2014-09-28 LAB — VITAMIN D 25 HYDROXY (VIT D DEFICIENCY, FRACTURES): Vit D, 25-Hydroxy: 8 ng/mL — ABNORMAL LOW (ref 30–100)

## 2014-09-30 ENCOUNTER — Other Ambulatory Visit: Payer: Self-pay | Admitting: *Deleted

## 2014-09-30 MED ORDER — TRIAMTERENE-HCTZ 37.5-25 MG PO TABS: 1.0000 | ORAL_TABLET | Freq: Every morning | ORAL | Status: DC

## 2014-09-30 MED ORDER — PROPRANOLOL HCL ER 80 MG PO CP24: 80.0000 mg | ORAL_CAPSULE | Freq: Every day | ORAL | Status: DC

## 2014-09-30 NOTE — Telephone Encounter (Signed)
Refills sent- pt called to request as pharmacy did not receive them from her last OV.

## 2014-10-02 ENCOUNTER — Telehealth: Payer: Self-pay | Admitting: Radiology

## 2014-10-02 NOTE — Telephone Encounter (Signed)
Pt calling about lab results  

## 2014-10-04 ENCOUNTER — Encounter: Payer: Self-pay | Admitting: Family Medicine

## 2014-10-04 DIAGNOSIS — E785 Hyperlipidemia, unspecified: Secondary | ICD-10-CM | POA: Insufficient documentation

## 2014-10-04 DIAGNOSIS — I10 Essential (primary) hypertension: Secondary | ICD-10-CM | POA: Insufficient documentation

## 2014-10-04 DIAGNOSIS — E559 Vitamin D deficiency, unspecified: Secondary | ICD-10-CM | POA: Insufficient documentation

## 2014-10-04 MED ORDER — ERGOCALCIFEROL 1.25 MG (50000 UT) PO CAPS: 50000.0000 [IU] | ORAL_CAPSULE | ORAL | Status: DC

## 2014-11-16 ENCOUNTER — Encounter: Payer: Self-pay | Admitting: Family Medicine

## 2014-12-21 ENCOUNTER — Telehealth: Payer: Self-pay

## 2014-12-21 NOTE — Telephone Encounter (Signed)
Pt states she was told she could call and request that she be prescribed an anti-depressant   Best phone [phone]  [facility]

## 2014-12-22 MED ORDER — BUPROPION HCL ER (SR) 150 MG PO TB12: 150.0000 mg | ORAL_TABLET | Freq: Every day | ORAL | Status: DC

## 2014-12-22 NOTE — Telephone Encounter (Signed)
So is she taking her celexa 20 - is she only taking 1 tab a day? Is it working?  As I wrote in her last note, if she was still having fatigue we could consider starting a low dose of wellbutrin? If she is just depressed but her energy is ok, then we should considering increasing her celexa to 40mg  daily.

## 2014-12-22 NOTE — Telephone Encounter (Signed)
Called pt, will start her on wellbutrin SR 150 qam - call if not working, cont citalopram 20

## 2014-12-22 NOTE — Telephone Encounter (Signed)
Spoke with pt. She has been taking the Celexa 20mg  daily. She says she thinks it is working. But she still feels fatigue, not so much depressed tho. She says she just stays in bed late and doesn't have the energy to get up, even though she gets plenty of sleep. She is willing to go ahead with the low dose Wellbutrin, although she said she would be willing to try another drug instead of the wellbutrin if you thought it ok. She just really wants something to "get her going".

## 2014-12-22 NOTE — Telephone Encounter (Signed)
Dr Shaw please advise. 

## 2015-02-24 ENCOUNTER — Encounter: Payer: Self-pay | Admitting: *Deleted

## 2015-03-05 ENCOUNTER — Other Ambulatory Visit: Payer: Self-pay | Admitting: Family Medicine

## 2015-03-24 ENCOUNTER — Ambulatory Visit (INDEPENDENT_AMBULATORY_CARE_PROVIDER_SITE_OTHER): Payer: Medicare Other | Admitting: Family Medicine

## 2015-03-24 VITALS — BP 104/62 | HR 54 | Temp 98.3°F | Resp 18 | Ht 63.0 in | Wt 175.0 lb

## 2015-03-24 DIAGNOSIS — Z23 Encounter for immunization: Secondary | ICD-10-CM

## 2015-03-24 DIAGNOSIS — S51802A Unspecified open wound of left forearm, initial encounter: Secondary | ICD-10-CM | POA: Diagnosis not present

## 2015-03-24 MED ORDER — MUPIROCIN 2 % EX OINT: 1.0000 "application " | TOPICAL_OINTMENT | Freq: Three times a day (TID) | CUTANEOUS | Status: DC

## 2015-03-24 NOTE — Patient Instructions (Signed)
Continue cleansing with soap and water 1-2 times per day,  keep clean and covered especially in dusty or dirty conditions. Okay to apply Bactroban ointment 3 times per day  For the next 5-7 days.  If any further spread of redness and wound oriented to other areas of arm, return as you may need to be on oral antibiotics. Return to the clinic or go to the nearest emergency room if any of your symptoms worsen or new symptoms occur.

## 2015-03-24 NOTE — Progress Notes (Addendum)
Subjective:  This chart was scribed for Rebecca Ray, MD by [facility], medical scribe at [facility].The patient was seen in exam room 02 and the patient's care was started at 9:23 AM.   Patient ID: Rebecca Lopez, female    DOB: Jan 28, 1946, 69 y.o.   MRN: [id number] Chief Complaint  Patient presents with  . Abrasion    left arm, x 5 days   HPI HPI Comments: Rebecca Lopez is a 70 y.o. female who presents to [facility] complaining of an abrasion on her left forearm acute onset 4-5 days ago. She was carrying a box because she is moving across town and scraped her arm against wood in the garage. Area became red and irritated two days ago. Today the abrasion is improved from yesterday.  Last tetanus shot was 10-15 years ago.  Patient Active Problem List   Diagnosis Date Noted  . Hyperlipidemia 10/04/2014  . Essential hypertension 10/04/2014  . Vitamin D deficiency 10/04/2014  . Family history of malignant neoplasm of gastrointestinal tract 03/10/2013   Past Medical History  Diagnosis Date  . Depression   . Allergy   . Arthritis   . Hyperlipidemia    Past Surgical History  Procedure Laterality Date  . Appendectomy    . Abdominal hysterectomy    . Bariatric surgery    . Arm surgery    . Ctr Bilateral 2005    had right hand done 1999  . Tonsillectomy     No Known Allergies Prior to Admission medications   Medication Sig Start Date End Date Taking? Authorizing Provider  buPROPion (WELLBUTRIN SR) 150 MG 12 hr tablet Take 1 tablet (150 mg total) by mouth daily. 12/22/14  Yes Shawnee Knapp, MD  cetirizine (ZYRTEC) 10 MG tablet Take 1 tablet (10 mg total) by mouth daily. 08/16/14  Yes Shawnee Knapp, MD  citalopram (CELEXA) 20 MG tablet Take 1 tablet (20 mg total) by mouth daily. PATIENT NEEDS OFFICE VISIT FOR ADDITIONAL REFILLS 03/07/15  Yes Shawnee Knapp, MD  ergocalciferol (VITAMIN D2) 50000 UNITS capsule Take 1 capsule (50,000 Units total) by mouth  once a week. 10/04/14  Yes Shawnee Knapp, MD  fluticasone (FLONASE) 50 MCG/ACT nasal spray Place 2 sprays into both nostrils at bedtime. 08/16/14  Yes Shawnee Knapp, MD  potassium chloride SA (K-DUR,KLOR-CON) 20 MEQ tablet Take 1 tablet (20 mEq total) by mouth daily. 03/02/14  Yes Heather M Marte, PA-C  propranolol ER (INDERAL LA) 80 MG 24 hr capsule Take 1 capsule (80 mg total) by mouth daily. 09/30/14  Yes Mancel Bale, PA-C  triamterene-hydrochlorothiazide (MAXZIDE-25) 37.5-25 MG per tablet Take 1 tablet by mouth every morning. 09/30/14  Yes Mancel Bale, PA-C   History   Social History  . Marital Status: Married    Spouse Name: N/A  . Number of Children: N/A  . Years of Education: N/A   Occupational History  . Not on file.   Social History Main Topics  . Smoking status: Never Smoker   . Smokeless tobacco: Never Used  . Alcohol Use: Yes     Comment: 3-4 glasses per week  . Drug Use: No  . Sexual Activity: Not on file   Other Topics Concern  . Not on file   Social History Narrative   Review of Systems  Skin: Positive for color change and wound.      Objective:  BP 104/62 mmHg  Pulse 54  Temp(Src) 98.3 F (36.8 C)  Resp 18  Ht 5\' 3"  (1.6 m)  Wt 175 lb (79.379 kg)  BMI 31.01 kg/m2  SpO2 98% Physical Exam  Constitutional: She is oriented to person, place, and time. She appears well-developed and well-nourished. No distress.  HENT:  Head: Normocephalic and atraumatic.  Eyes: Pupils are equal, round, and reactive to light.  Neck: Normal range of motion.  Cardiovascular: Normal rate and regular rhythm.   Pulmonary/Chest: Effort normal. No respiratory distress.  Musculoskeletal: Normal range of motion.  Neurological: She is alert and oriented to person, place, and time.  Skin: Skin is warm and dry.  Left dorsal forearm there is healing abrasion with a central aspect measuring 1.5 cm retraction of the central skin. There is minimal erythema on surrounding retraction only. No  surrounding fluctuance.  Psychiatric: She has a normal mood and affect. Her behavior is normal.  Nursing note and vitals reviewed.     Assessment & Plan:   [name] is a 69 y.o. female Wound, open, arm, forearm, left, initial encounter - Plan: mupirocin ointment (BACTROBAN) 2 %, Tdap vaccine greater than or equal to 7yo IM  Skin tear/wound of left forearm now for 29 days old. Minimal erythema around wound but this may be normal healing process as appears to be retracting around central eschar. Continue Cipro and water cleansing, keep clean and covered with 30 her dusty conditions, Bactroban ointment topical 3 times a day 5-7 days. Return if any increased redness or worsening in appearance.   Tdap Updated.  Meds ordered this encounter  Medications  . mupirocin ointment (BACTROBAN) 2 %    Sig: Apply 1 application topically 3 (three) times daily. For 1 week.    Dispense:  22 g    Refill:  0   Patient Instructions   Continue cleansing with soap and water 1-2 times per day,  keep clean and covered especially in dusty or dirty conditions. Okay to apply Bactroban ointment 3 times per day  For the next 5-7 days.  If any further spread of redness and wound oriented to other areas of arm, return as you may need to be on oral antibiotics. Return to the clinic or go to the nearest emergency room if any of your symptoms worsen or new symptoms occur.     I personally performed the services described in this documentation, which was scribed in my presence. The recorded information has been reviewed and considered, and addended by me as needed.

## 2015-04-20 ENCOUNTER — Other Ambulatory Visit: Payer: Self-pay | Admitting: Family Medicine

## 2015-05-07 IMAGING — CR DG ABDOMEN 1V
1 series · 1 of 1 positions shown · non-contrast
Comparison: None

CLINICAL DATA: Abdominal pain

EXAM:
ABDOMEN - 1 VIEW

[AP]
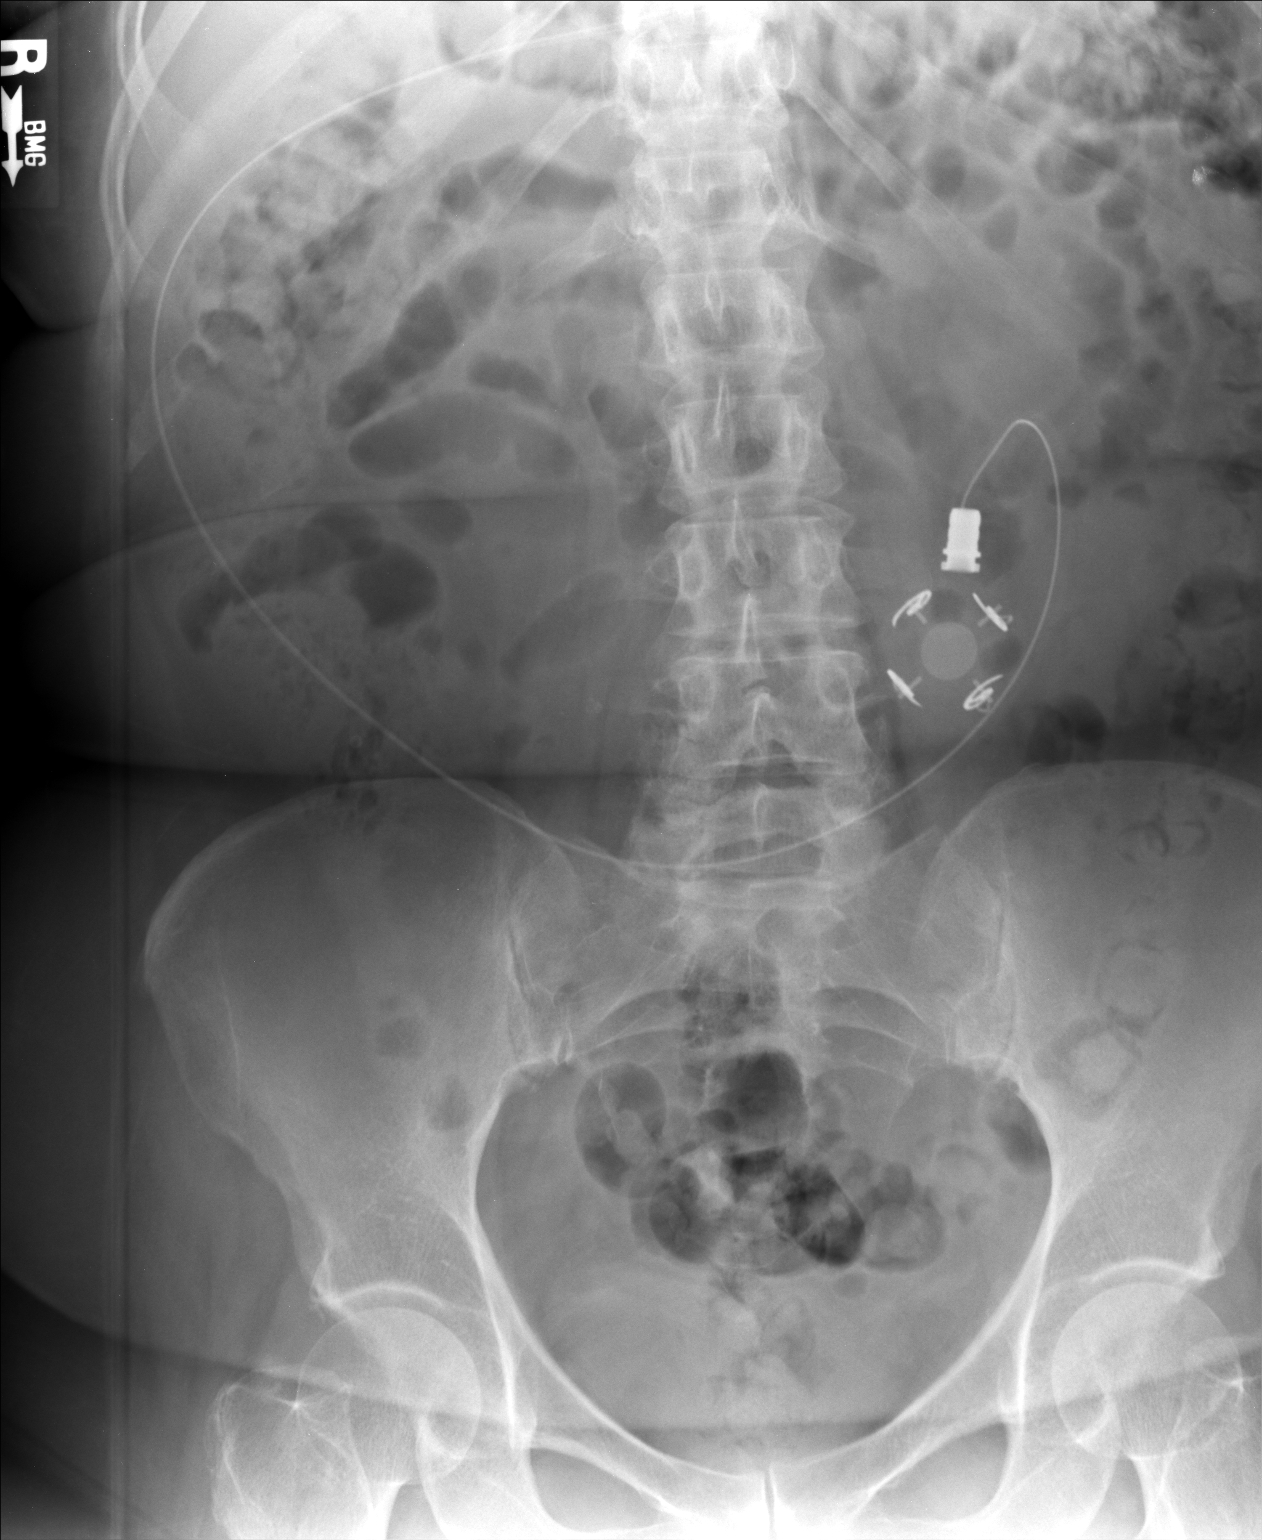

[1 of 1 positions shown; findings below may reference images not displayed]

FINDINGS: The bowel gas pattern is normal. Gastric band catheter is identified
with of in the left lower quadrant of the abdomen. 5 mm calcific
density is adjacent to the transverse process of the L4 vertebra. No
additional abnormal abdominal or pelvic calcifications noted.
IMPRESSION: 1. Calcification along the course of the right ureter may represent
ureteral calculus. Correlate for any clinical signs or symptoms of
renal colic.
2. Normal bowel gas pattern.

## 2015-05-25 ENCOUNTER — Other Ambulatory Visit: Payer: Self-pay | Admitting: Family Medicine

## 2015-11-14 ENCOUNTER — Other Ambulatory Visit: Payer: Self-pay | Admitting: Physician Assistant

## 2016-10-28 ENCOUNTER — Telehealth: Payer: Self-pay | Admitting: *Deleted

## 2016-10-28 NOTE — Telephone Encounter (Signed)
Scheduled AWV 10/31/16 @9 .

## 2016-10-30 NOTE — Progress Notes (Addendum)
Subjective:   Rebecca Lopez is a 71 y.o. female who presents for an Initial Medicare Annual Wellness Visit.  The Patient was informed that the wellness visit is to identify future health risk and educate and initiate measures that can reduce risk for increased disease through the lifespan.   Pt would like to be restarted on her medications, an antidepressant and BP meds.   Pt would also like to discuss dexa scan, mammogram, shingles vaccine and high vs low dose influenza vaccine.  Review of Systems    No ROS.  Medicare Wellness Visit.  Sleep patterns: Pt states she sleeps 5-8 hours/night. Feels rested. Naps occ.   Home Safety/Smoke Alarms:  Feels safe in home. Smoke alarms in place.   Living environment; residence and Firearm Safety: Lives with sister. One story home. No firearms.  Seat Belt Safety/Bike Helmet: Wears seat belt.   Counseling:   Eye Exam- 2 years ago, No changes.  Dental- 3 years ago. Dental resource list given.  Female:   Pap- Aged out. Hysterectomy.       Mammo- 4 years ago. Decline.      Dexa scan-  Will talk to Dr about that.       CCS- 03/24/2013 adenomatous polyp removed. 5 year recall.  Cardiac Risk Factors include: advanced age (>79men, >96 women);obesity (BMI >30kg/m2);sedentary lifestyle     Objective:    Today's Vitals   10/31/16 0905  BP: (!) 165/90  Pulse: 77  Resp: 13  Temp: 97.1 F (36.2 C)  TempSrc: Oral  SpO2: 98%  Weight: 183 lb 9.6 oz (83.3 kg)  Height: 5' 3.5" (1.613 m)   Body mass index is 32.01 kg/m.   Current Medications (verified) Outpatient Encounter Prescriptions as of 10/31/2016  Medication Sig  . metoprolol tartrate (LOPRESSOR) 25 MG tablet Take 25 mg by mouth as needed (3-4 times/week).  Marland Kitchen buPROPion (WELLBUTRIN SR) 150 MG 12 hr tablet Take 1 tablet (150 mg total) by mouth daily. (Patient not taking: Reported on 10/31/2016)  . cetirizine (ZYRTEC) 10 MG tablet Take 1 tablet (10 mg total) by mouth daily. (Patient not  taking: Reported on 10/31/2016)  . citalopram (CELEXA) 20 MG tablet Take 1 tablet (20 mg total) by mouth daily. PATIENT NEEDS OFFICE VISIT FOR ADDITIONAL REFILLS (Patient not taking: Reported on 10/31/2016)  . ergocalciferol (VITAMIN D2) 50000 UNITS capsule Take 1 capsule (50,000 Units total) by mouth once a week. (Patient not taking: Reported on 10/31/2016)  . fluticasone (FLONASE) 50 MCG/ACT nasal spray Place 2 sprays into both nostrils at bedtime. (Patient not taking: Reported on 10/31/2016)  . mupirocin ointment (BACTROBAN) 2 % Apply 1 application topically 3 (three) times daily. For 1 week. (Patient not taking: Reported on 10/31/2016)  . potassium chloride SA (K-DUR,KLOR-CON) 20 MEQ tablet Take 1 tablet (20 mEq total) by mouth daily. (Patient not taking: Reported on 10/31/2016)  . propranolol ER (INDERAL LA) 80 MG 24 hr capsule TAKE 1 CAPSULE BY MOUTH DAILY (Patient not taking: Reported on 10/31/2016)  . triamterene-hydrochlorothiazide (MAXZIDE-25) 37.5-25 MG tablet TAKE 1 TABLET BY MOUTH EVERY MORNING (Patient not taking: Reported on 10/31/2016)   No facility-administered encounter medications on file as of 10/31/2016.     Allergies (verified) Patient has no known allergies.   History: Past Medical History:  Diagnosis Date  . Allergy   . Arthritis   . Depression   . Hyperlipidemia    Past Surgical History:  Procedure Laterality Date  . ABDOMINAL HYSTERECTOMY    .  APPENDECTOMY    . arm surgery    . BARIATRIC SURGERY    . CTR Bilateral 2005   had right hand done 1999  . TONSILLECTOMY     Family History  Problem Relation Age of Onset  . Hypertension Mother   . Colon cancer Mother   . Colon polyps Mother   . Heart disease Father   . Cancer Sister   . Colon cancer Sister   . Stroke Maternal Grandfather    Social History   Occupational History  . Not on file.   Social History Main Topics  . Smoking status: Never Smoker  . Smokeless tobacco: Never Used  . Alcohol use Yes     Comment:  gin and wine 2x/month  . Drug use: No  . Sexual activity: Yes    Tobacco Counseling Counseling given: Not Answered   Activities of Daily Living In your present state of health, do you have any difficulty performing the following activities: 10/31/2016  Hearing? N  Vision? N  Difficulty concentrating or making decisions? N  Walking or climbing stairs? N  Dressing or bathing? N  Doing errands, shopping? N  Preparing Food and eating ? N  Using the Toilet? N  In the past six months, have you accidently leaked urine? Y  Do you have problems with loss of bowel control? N  Managing your Medications? N  Managing your Finances? N  Housekeeping or managing your Housekeeping? N  Some recent data might be hidden    Immunizations and Health Maintenance Immunization History  Administered Date(s) Administered  . Influenza,inj,Quad PF,36+ Mos 08/10/2013, 08/16/2014  . Tdap 03/24/2015   Health Maintenance Due  Topic Date Due  . MAMMOGRAM  11/12/1997  . DEXA SCAN  11/12/2012  . PNA vac Low Risk Adult (1 of 2 - PCV13) 11/12/2012  . INFLUENZA VACCINE  04/02/2016    Patient Care Team: Darreld Mclean, MD as PCP - General (Family Medicine)  Indicate any recent Medical Services you may have received from other than [facility] providers in the past year (date may be approximate).     Assessment:   This is a routine wellness examination for [facility]. Physical assessment deferred to PCP.   Hearing/Vision screen Hearing Screening Comments: Able to hear conversational tones w/o difficulty. No issues reported.   Vision Screening Comments: 2 years ago. Wears bifocals.   Dietary issues and exercise activities discussed: Exercise limited by: None identified Diet (meal preparation, eat out, water intake, caffeinated beverages, dairy products, fruits and vegetables): Two meals/day. "A lot of diet coke."  4 glasses water/day. Orange juice. Breakfast: Chic fil a 3/week. Taco salad. Dinner:    Pasta,  salad, burgers.   Goals    . Get back on medications      Depression Screen PHQ 2/9 Scores 10/31/2016 03/24/2015  PHQ - 2 Score 0 0    Fall Risk Fall Risk  10/31/2016  Falls in the past year? Yes  Number falls in past yr: 2 or more  Injury with Fall? No  Risk Factor Category  High Fall Risk  Risk for fall due to : History of fall(s)    Cognitive Function:   Ad8 score reviewed for issues:  Issues making decisions: no  Less interest in hobbies / activities: no  Repeats questions, stories (family complaining): no  Trouble using ordinary gadgets (microwave, computer, phone): no  Forgets the month or year: no  Mismanaging finances: no  Remembering appts:no  Daily problems with thinking and/or  memory:no Ad8 score is=0          Screening Tests Health Maintenance  Topic Date Due  . MAMMOGRAM  11/12/1997  . DEXA SCAN  11/12/2012  . PNA vac Low Risk Adult (1 of 2 - PCV13) 11/12/2012  . INFLUENZA VACCINE  04/02/2016  . COLONOSCOPY  03/24/2018  . TETANUS/TDAP  03/23/2025  . Hepatitis C Screening  Completed      Plan:    Bring a copy of your advance directives to your next office visit.  During the course of the visit, Phaedra was educated and counseled about the following appropriate screening and preventive services:   Vaccines to include Pneumoccal, Influenza, Hepatitis B, Td, Zostavax, HCV - PNA  Given today.  Cardiovascular disease screening  Colorectal cancer screening  Bone density screening  Diabetes screening  Glaucoma screening  Mammography/PAP  Nutrition counseling  Smoking cessation counseling  Patient Instructions (the written plan) were given to the patient.    Ree Edman, RN   10/31/2016     I have reviewed the above MWE by Ms. Riki Sheer, RN and agree with her documentation J Copland MD 11/05/16

## 2016-10-30 NOTE — Progress Notes (Signed)
Pre visit review using our clinic review tool, if applicable. No additional management support is needed unless otherwise documented below in the visit note. 

## 2016-10-31 ENCOUNTER — Ambulatory Visit (INDEPENDENT_AMBULATORY_CARE_PROVIDER_SITE_OTHER): Payer: Medicare Other | Admitting: Family Medicine

## 2016-10-31 ENCOUNTER — Encounter: Payer: Self-pay | Admitting: Family Medicine

## 2016-10-31 VITALS — BP 165/90 | HR 77 | Temp 97.1°F | Resp 13 | Ht 63.5 in | Wt 183.6 lb

## 2016-10-31 DIAGNOSIS — E669 Obesity, unspecified: Secondary | ICD-10-CM | POA: Diagnosis not present

## 2016-10-31 DIAGNOSIS — Z23 Encounter for immunization: Secondary | ICD-10-CM

## 2016-10-31 DIAGNOSIS — Z8639 Personal history of other endocrine, nutritional and metabolic disease: Secondary | ICD-10-CM | POA: Diagnosis not present

## 2016-10-31 DIAGNOSIS — F3289 Other specified depressive episodes: Secondary | ICD-10-CM | POA: Diagnosis not present

## 2016-10-31 DIAGNOSIS — I10 Essential (primary) hypertension: Secondary | ICD-10-CM

## 2016-10-31 DIAGNOSIS — E7800 Pure hypercholesterolemia, unspecified: Secondary | ICD-10-CM

## 2016-10-31 DIAGNOSIS — E559 Vitamin D deficiency, unspecified: Secondary | ICD-10-CM

## 2016-10-31 DIAGNOSIS — Z0000 Encounter for general adult medical examination without abnormal findings: Secondary | ICD-10-CM | POA: Diagnosis not present

## 2016-10-31 DIAGNOSIS — E2839 Other primary ovarian failure: Secondary | ICD-10-CM | POA: Diagnosis not present

## 2016-10-31 DIAGNOSIS — Z1239 Encounter for other screening for malignant neoplasm of breast: Secondary | ICD-10-CM

## 2016-10-31 DIAGNOSIS — Z5181 Encounter for therapeutic drug level monitoring: Secondary | ICD-10-CM | POA: Diagnosis not present

## 2016-10-31 LAB — LIPID PANEL
CHOLESTEROL: 281 mg/dL — AB (ref 0–200)
HDL: 66.1 mg/dL (ref >39.00)
LDL CALC: 195 mg/dL — AB (ref 0–99)
NONHDL: 214.5
Total CHOL/HDL Ratio: 4
Triglycerides: 98 mg/dL (ref 0.0–149.0)
VLDL: 19.6 mg/dL (ref 0.0–40.0)

## 2016-10-31 LAB — COMPREHENSIVE METABOLIC PANEL
ALK PHOS: 97 U/L (ref 39–117)
ALT: 15 U/L (ref 0–35)
AST: 22 U/L (ref 0–37)
Albumin: 4.2 g/dL (ref 3.5–5.2)
BUN: 20 mg/dL (ref 6–23)
CO2: 24 mEq/L (ref 19–32)
Calcium: 9.9 mg/dL (ref 8.4–10.5)
Chloride: 107 mEq/L (ref 96–112)
Creatinine, Ser: 0.95 mg/dL (ref 0.40–1.20)
GFR: 62 mL/min (ref >60.00)
GLUCOSE: 84 mg/dL (ref 70–99)
POTASSIUM: 4 meq/L (ref 3.5–5.1)
SODIUM: 139 meq/L (ref 135–145)
TOTAL PROTEIN: 7.4 g/dL (ref 6.0–8.3)
Total Bilirubin: 0.4 mg/dL (ref 0.2–1.2)

## 2016-10-31 LAB — CBC
HEMATOCRIT: 43.7 % (ref 36.0–46.0)
HEMOGLOBIN: 14.9 g/dL (ref 12.0–15.0)
MCHC: 34.1 g/dL (ref 30.0–36.0)
MCV: 92.6 fl (ref 78.0–100.0)
Platelets: 210 10*3/uL (ref 150.0–400.0)
RBC: 4.72 Mil/uL (ref 3.87–5.11)
RDW: 14.1 % (ref 11.5–15.5)
WBC: 6.4 10*3/uL (ref 4.0–10.5)

## 2016-10-31 LAB — VITAMIN D 25 HYDROXY (VIT D DEFICIENCY, FRACTURES): VITD: 20.06 ng/mL — ABNORMAL LOW (ref 30.00–100.00)

## 2016-10-31 MED ORDER — CITALOPRAM HYDROBROMIDE 20 MG PO TABS: 20.0000 mg | ORAL_TABLET | Freq: Every day | ORAL | 3 refills | Status: DC

## 2016-10-31 MED ORDER — LISINOPRIL-HYDROCHLOROTHIAZIDE 10-12.5 MG PO TABS: 1.0000 | ORAL_TABLET | Freq: Every day | ORAL | 3 refills | Status: DC

## 2016-10-31 NOTE — Patient Instructions (Signed)
It was great to see you today- take care and we will be in touch with your labs

## 2016-10-31 NOTE — Progress Notes (Addendum)
[place] at [facility] [address], [place], [place] [address] [phone]  Date:  10/31/2016   Name:  Rebecca Lopez   DOB:  30-Aug-1946   MRN:  [id number]  PCP:  Lamar Blinks, MD    Chief Complaint: Medicare Wellness; Medication Refill (Pt would like restarted on antidepressant and BP meds); and Patient Education (Pt would like info on mammogram, dexa scan, high vs low dose flu vaccine and shingles vaccine.)   History of Present Illness:  Rebecca Lopez is a 71 y.o. very pleasant female patient who presents with the following:  Here to establish care and get back on some of her needed medicsations today. I last saw her at [facility] in 09/2013 History of hyperlipidemia, HTN, allergic rhinitis, vitamin D def Last mammo: several years ago Bone density: never done Colonoscopy 2014 Pneumonia vaccine: Flu shot: declines today but may get next year Shingles vaccine: not done yet  Works at [employer] 3 hours 3 times per week, @ [employer].  Does not check her B/P at home.  But she knows that it is high.  She has been using some of her sister's BB at times to control her BP   Would like to be back on her antidepressants and blood pressure medication.  No previous problems with Wellbutrin, Celexa, Maxide, Propanolol.  States that Maxide or Propranolol was for migraines. She is not currently having any issues with migraine and does not think that she needs these meds currently  Describes her depression is mild to moderate, she is not sleeping well, no SI or HI.  However she thinks that she was better on her medications.  Does not plan on using flonase or zyrtec. Never on cholesterol medication.  She said that he good cholesterol has always been very high.  Last Mammogram, 4 years ago.  No previous issues. Never had DEXA, but would like to.  Had lap band 8 years ago.  Didn't loose a lot of weight. The highest her weight was 220  lbs.  Had gotten down to 170.  Thinks that she has gained   Is concerned about costs of healthcare.  Does tend to have some edema in her bilateral ankles at baseline.   Patient Active Problem List   Diagnosis Date Noted  . Hyperlipidemia 10/04/2014  . Essential hypertension 10/04/2014  . Vitamin D deficiency 10/04/2014  . Family history of malignant neoplasm of gastrointestinal tract 03/10/2013    Past Medical History:  Diagnosis Date  . Allergy   . Arthritis   . Depression   . Hyperlipidemia     Past Surgical History:  Procedure Laterality Date  . ABDOMINAL HYSTERECTOMY    . APPENDECTOMY    . arm surgery    . BARIATRIC SURGERY    . CTR Bilateral 2005   had right hand done 1999  . TONSILLECTOMY      Social History  Substance Use Topics  . Smoking status: Never Smoker  . Smokeless tobacco: Never Used  . Alcohol use Yes     Comment: gin and wine 2x/month    Family History  Problem Relation Age of Onset  . Hypertension Mother   . Colon cancer Mother   . Colon polyps Mother   . Heart disease Father   . Cancer Sister   . Colon cancer Sister   . Stroke Maternal Grandfather     No Known Allergies  Medication list has been reviewed  and updated.  Current Outpatient Prescriptions on File Prior to Visit  Medication Sig Dispense Refill  . mupirocin ointment (BACTROBAN) 2 % Apply 1 application topically 3 (three) times daily. For 1 week. (Patient not taking: Reported on 10/31/2016) 22 g 0   No current facility-administered medications on file prior to visit.     Review of Systems:  As per HPI- otherwise negative.   Physical Examination: Vitals:   10/31/16 0905  BP: (!) 165/90  Pulse: 77  Resp: 13  Temp: 97.1 F (36.2 C)   Vitals:   10/31/16 0905  Weight: 183 lb 9.6 oz (83.3 kg)  Height: 5' 3.5" (1.613 m)   Body mass index is 32.01 kg/m. Ideal Body Weight: Weight in (lb) to have BMI = 25: 143.1  GEN: WDWN, NAD, Non-toxic, A & O x 3, overweight,  looks well HEENT: Atraumatic, Normocephalic. Neck supple. No masses, No LAD.  Bilateral TM wnl, oropharynx normal.  PEERL,EOMI.   Ears and Nose: No external deformity. CV: RRR, No M/G/R. No JVD. No thrill. No extra heart sounds. PULM: CTA B, no wheezes, crackles, rhonchi. No retractions. No resp. distress. No accessory muscle use. ABD: S, NT, ND, +BS. No rebound. No HSM. EXTR: No c/c/e NEURO Normal gait.  PSYCH: Normally interactive. Conversant. Not depressed or anxious appearing.  Calm demeanor.    Assessment and Plan:  Need for vaccination with 13-polyvalent pneumococcal conjugate vaccine - Plan: Pneumococcal conjugate vaccine 13-valent IM  Encounter for Medicare annual wellness exam  Obesity, unspecified classification, unspecified obesity type, unspecified whether serious comorbidity present - Plan: Lipid panel  Essential hypertension - Plan: lisinopril-hydrochlorothiazide (PRINZIDE,ZESTORETIC) 10-12.5 MG tablet, Comprehensive metabolic panel  Estrogen deficiency - Plan: DG Bone Density  Screening for breast cancer - Plan: MM SCREENING BREAST TOMO BILATERAL  Medication monitoring encounter - Plan: CBC  Other depression - Plan: citalopram (CELEXA) 20 MG tablet  History of vitamin D deficiency - Plan: Vitamin D (25 hydroxy)  Vitamin D deficiency - Plan: Vitamin D (25 hydroxy)  Will start back on medication for her BP- prinzide, and on Celexa for her depression Will need to see her back soon to follow-up on her BP Labs pending as above- will be in touch with her  Ordered dexa and mammo for her today  Signed Lamar Blinks, MD  Received her labs and gave her a call 3/2 Need to start rx vitamin D- she has used this before. Would like to start her on a statin and this is ok with her.  Will rx theses 2 things for her and send a letter with lab details for her review  Results for orders placed or performed in visit on 10/31/16  CBC  Result Value Ref Range   WBC 6.4 4.0 -  10.5 K/uL   RBC 4.72 3.87 - 5.11 Mil/uL   Platelets 210.0 150.0 - 400.0 K/uL   Hemoglobin 14.9 12.0 - 15.0 g/dL   HCT 43.7 36.0 - 46.0 %   MCV 92.6 78.0 - 100.0 fl   MCHC 34.1 30.0 - 36.0 g/dL   RDW 14.1 11.5 - 15.5 %  Comprehensive metabolic panel  Result Value Ref Range   Sodium 139 135 - 145 mEq/L   Potassium 4.0 3.5 - 5.1 mEq/L   Chloride 107 96 - 112 mEq/L   CO2 24 19 - 32 mEq/L   Glucose, Bld 84 70 - 99 mg/dL   BUN 20 6 - 23 mg/dL   Creatinine, Ser 0.95 0.40 - 1.20 mg/dL  Total Bilirubin 0.4 0.2 - 1.2 mg/dL   Alkaline Phosphatase 97 39 - 117 U/L   AST 22 0 - 37 U/L   ALT 15 0 - 35 U/L   Total Protein 7.4 6.0 - 8.3 g/dL   Albumin 4.2 3.5 - 5.2 g/dL   Calcium 9.9 8.4 - 10.5 mg/dL   GFR 62.00 >60.00 mL/min  Lipid panel  Result Value Ref Range   Cholesterol 281 (H) 0 - 200 mg/dL   Triglycerides 98.0 0.0 - 149.0 mg/dL   HDL 66.10 >39.00 mg/dL   VLDL 19.6 0.0 - 40.0 mg/dL   LDL Cholesterol 195 (H) 0 - 99 mg/dL   Total CHOL/HDL Ratio 4    NonHDL 214.50   Vitamin D (25 hydroxy)  Result Value Ref Range   VITD 20.06 (L) 30.00 - 100.00 ng/mL

## 2016-11-01 ENCOUNTER — Encounter: Payer: Self-pay | Admitting: Family Medicine

## 2016-11-01 MED ORDER — LOVASTATIN 20 MG PO TABS: 20.0000 mg | ORAL_TABLET | Freq: Every day | ORAL | 3 refills | Status: DC

## 2016-11-01 MED ORDER — VITAMIN D (ERGOCALCIFEROL) 1.25 MG (50000 UNIT) PO CAPS: 50000.0000 [IU] | ORAL_CAPSULE | ORAL | 0 refills | Status: DC

## 2016-11-01 NOTE — Addendum Note (Signed)
Addended by: Lamar Blinks C on: 11/01/2016 01:17 PM   Modules accepted: Orders

## 2016-11-07 ENCOUNTER — Other Ambulatory Visit (HOSPITAL_BASED_OUTPATIENT_CLINIC_OR_DEPARTMENT_OTHER): Payer: Medicare Other

## 2016-11-07 ENCOUNTER — Ambulatory Visit (HOSPITAL_BASED_OUTPATIENT_CLINIC_OR_DEPARTMENT_OTHER): Payer: Medicare Other

## 2016-11-07 ENCOUNTER — Telehealth: Payer: Self-pay | Admitting: Family Medicine

## 2016-11-07 NOTE — Telephone Encounter (Signed)
Caller name: Relationship to patient: Self Can be reached: [phone]  Pharmacy:  Reason for call: Request call back to discuss reaction to medications.

## 2016-11-07 NOTE — Telephone Encounter (Signed)
Called her back- we had started her back on celexa and prinzide about one week ago- she has used these in the past but it had been a while since she was taking them. She is concerned as she has felt nauseated and tired since starting these meds. She took neither of them today and feels better No fever or other severe sx She will try going back on prizide but hold the celexa for about one week; she will let me know if sx persist on just the BP med Then plan to start on 10 mg of celexa for one month, increase to 20 if tolerated.  She will keep me closely apprised as to her progress

## 2016-12-05 ENCOUNTER — Ambulatory Visit (INDEPENDENT_AMBULATORY_CARE_PROVIDER_SITE_OTHER): Payer: Medicare Other | Admitting: Family Medicine

## 2016-12-05 ENCOUNTER — Encounter: Payer: Self-pay | Admitting: Family Medicine

## 2016-12-05 VITALS — BP 124/86 | HR 88 | Temp 98.7°F | Ht 63.5 in | Wt 178.0 lb

## 2016-12-05 DIAGNOSIS — T50905A Adverse effect of unspecified drugs, medicaments and biological substances, initial encounter: Secondary | ICD-10-CM

## 2016-12-05 DIAGNOSIS — I10 Essential (primary) hypertension: Secondary | ICD-10-CM | POA: Diagnosis not present

## 2016-12-05 DIAGNOSIS — T887XXA Unspecified adverse effect of drug or medicament, initial encounter: Secondary | ICD-10-CM

## 2016-12-05 DIAGNOSIS — H6123 Impacted cerumen, bilateral: Secondary | ICD-10-CM

## 2016-12-05 MED ORDER — AMLODIPINE BESYLATE 5 MG PO TABS: 5.0000 mg | ORAL_TABLET | Freq: Every day | ORAL | 3 refills | Status: DC

## 2016-12-05 NOTE — Progress Notes (Signed)
[place] at [facility] [address], [place], [place] [address] [phone]  Date:  12/05/2016   Name:  Rebecca Lopez   DOB:  Aug 03, 1946   MRN:  [id number]  PCP:  Lamar Blinks, MD    Chief Complaint: Follow-up (Pt here to f/u BP. Pt states that she stopped Lisinopril-hydrochlorothiazide on this past Monday due to c/o abdominal discomfort and severe sweating. Pt stopped celexa last Thursday because at first she thought her sx's were due to this med but it wasn't. Would like to disucss if she should start back taking the celexa. )   History of Present Illness:  Rebecca Lopez is a 71 y.o. very pleasant female patient who presents with the following:  Seen by myself about a month ago We put her on medication for her BP and depression as below  Will start back on medication for her BP- prinzide, and on Celexa for her depression Will need to see her back soon to follow-up on her BP Labs pending as above- will be in touch with her  Ordered dexa and mammo for her today  Signed Lamar Blinks, MD  Received her labs and gave her a call 3/2 Need to start rx vitamin D- she has used this before. Would like to start her on a statin and this is ok with her.  Will rx theses 2 things for her and send a letter with lab details for her review   She noted a "stomachache for about 30 days" since our last visit.  She did not have any vomiting and was able to eat, but noticed a dull ache in her epigastric area since she started the new medications.   She thought this might be due to one of her meds but was not sure which one.  Finally tried stopping lisinopril/ hctz a couple of days ago and this resolved her sx.   She also tried going off and then back on the celexa and this was not the culprit.   She stopped his lisinopril 2 days ago.  Her BP looks great today but we wonder if this med is still working for her  She did not start the lovastatin as  of yet as she was afraid to add any other variables She also has noted ear wax build up- tried to remove at home but feel that there may still be some in her right ear  Patient Active Problem List   Diagnosis Date Noted  . Hyperlipidemia 10/04/2014  . Essential hypertension 10/04/2014  . Vitamin D deficiency 10/04/2014  . Family history of malignant neoplasm of gastrointestinal tract 03/10/2013    Past Medical History:  Diagnosis Date  . Allergy   . Arthritis   . Depression   . Hyperlipidemia     Past Surgical History:  Procedure Laterality Date  . ABDOMINAL HYSTERECTOMY    . APPENDECTOMY    . arm surgery    . BARIATRIC SURGERY    . CTR Bilateral 2005   had right hand done 1999  . TONSILLECTOMY      Social History  Substance Use Topics  . Smoking status: Never Smoker  . Smokeless tobacco: Never Used  . Alcohol use Yes     Comment: gin and wine 2x/month    Family History  Problem Relation Age of Onset  . Hypertension Mother   . Colon cancer Mother   . Colon polyps Mother   . Heart  disease Father   . Cancer Sister   . Colon cancer Sister   . Stroke Maternal Grandfather     No Known Allergies  Medication list has been reviewed and updated.  Current Outpatient Prescriptions on File Prior to Visit  Medication Sig Dispense Refill  . Vitamin D, Ergocalciferol, (DRISDOL) 50000 units CAPS capsule Take 1 capsule (50,000 Units total) by mouth every 7 (seven) days. 12 capsule 0  . citalopram (CELEXA) 20 MG tablet Take 1 tablet (20 mg total) by mouth daily. (Patient not taking: Reported on 12/05/2016) 90 tablet 3  . lisinopril-hydrochlorothiazide (PRINZIDE,ZESTORETIC) 10-12.5 MG tablet Take 1 tablet by mouth daily. (Patient not taking: Reported on 12/05/2016) 90 tablet 3  . lovastatin (MEVACOR) 20 MG tablet Take 1 tablet (20 mg total) by mouth at bedtime. (Patient not taking: Reported on 12/05/2016) 90 tablet 3   No current facility-administered medications on file prior to  visit.     Review of Systems:  As per HPI- otherwise negative.   Physical Examination: Vitals:   12/05/16 1020  BP: 124/86  Pulse: 88  Temp: 98.7 F (37.1 C)   Vitals:   12/05/16 1020  Weight: 178 lb (80.7 kg)  Height: 5' 3.5" (1.613 m)   Body mass index is 31.04 kg/m. Ideal Body Weight: Weight in (lb) to have BMI = 25: 143.1  GEN: WDWN, NAD, Non-toxic, A & O x 3, looks well, overweight.   HEENT: Atraumatic, Normocephalic. Neck supple. No masses, No LAD.  Cerumen impaction bilatelly  Ears and Nose: No external deformity. CV: RRR, No M/G/R. No JVD. No thrill. No extra heart sounds. PULM: CTA B, no wheezes, crackles, rhonchi. No retractions. No resp. distress. No accessory muscle use. ABD: S, NT, ND EXTR: No c/c/e NEURO Normal gait.  PSYCH: Normally interactive. Conversant. Not depressed or anxious appearing.  Calm demeanor.  Irrigated both ears with warm water and removed wax, normal TM and ear canal   Assessment and Plan: Essential hypertension - Plan: amLODipine (NORVASC) 5 MG tablet  Bilateral impacted cerumen  Adverse effect of drug, initial encounter  Here today to discuss a couple of concerns She did not tolerate lisinopril/ hctz- no allergic reaction but stomach upset/ pain.  Will stop this medication, and she will monitor her BP at home. If BP goes back up again as we suspect it will, will start on amlodipine 5 mg  Plan to recheck here in 2-3 months to check on her progress She will continue celexa   Signed Lamar Blinks, MD

## 2016-12-05 NOTE — Progress Notes (Signed)
Pre visit review using our clinic review tool, if applicable. No additional management support is needed unless otherwise documented below in the visit note. 

## 2016-12-05 NOTE — Patient Instructions (Signed)
We are going to have you check your BP at home over the next few days.  If you start going over 140/90 go ahead and start on the amlodipine 5 mg for your blood pressure  Please let me know how your BP looks at home in any case I think you can start back on the celexa at this point  Let me know if any concerns and let's plan to check back in 2-3 months.

## 2017-01-16 ENCOUNTER — Ambulatory Visit (INDEPENDENT_AMBULATORY_CARE_PROVIDER_SITE_OTHER): Payer: Medicare Other | Admitting: Family Medicine

## 2017-01-16 VITALS — BP 140/84 | HR 71 | Temp 98.1°F | Ht 63.5 in | Wt 182.0 lb

## 2017-01-16 DIAGNOSIS — L299 Pruritus, unspecified: Secondary | ICD-10-CM | POA: Diagnosis not present

## 2017-01-16 DIAGNOSIS — I10 Essential (primary) hypertension: Secondary | ICD-10-CM | POA: Diagnosis not present

## 2017-01-16 MED ORDER — LOSARTAN POTASSIUM-HCTZ 50-12.5 MG PO TABS: 1.0000 | ORAL_TABLET | Freq: Every day | ORAL | 3 refills | Status: DC

## 2017-01-16 NOTE — Patient Instructions (Signed)
It is possible that the amlodipine is causing the itchy spots on your skin- we will have you stop this medication and start losartan/ hctz for your blood pressure.  Take this new BP med once a day; however, you may certainly start with a 1/2 tablet and increase to a whole if your blood pressure is higher than 140/90.  Please let me know if your itchy spots do not resolve in the next few days!

## 2017-01-16 NOTE — Progress Notes (Signed)
[place] at [facility] [address] [place], [place] [address] [phone] [id number]  Date:  01/16/2017   Name:  Rebecca Lopez   DOB:  02/15/1946   MRN:  [id number]  PCP:  Darreld Mclean, MD    Chief Complaint: Rash (c/o rash that comes and goes on different places x 2-3 weeks. )   History of Present Illness:  Rebecca Lopez is a 71 y.o. very pleasant female patient who presents with the following:  Here today with concern of possible "angioedema"- she has noted some itchy bumps on her skin for the last 3-4 weeks. These little bumps will come and go and move around on her skin. They are quite itchy- have not gotten better with anti itch cream, benadryl or zyrtec.  No SOB, no swelling of her lips, tongue or face. She did start amlodipine - new medication a few days prior to onset of this issue  Otherwise she has felt well Never had this in the past No fever  No GI symptoms   BP Readings from Last 3 Encounters:  01/16/17 140/84  12/05/16 124/86  10/31/16 (!) 165/90     Patient Active Problem List   Diagnosis Date Noted  . Hyperlipidemia 10/04/2014  . Essential hypertension 10/04/2014  . Vitamin D deficiency 10/04/2014  . Family history of malignant neoplasm of gastrointestinal tract 03/10/2013    Past Medical History:  Diagnosis Date  . Allergy   . Arthritis   . Depression   . Hyperlipidemia     Past Surgical History:  Procedure Laterality Date  . ABDOMINAL HYSTERECTOMY    . APPENDECTOMY    . arm surgery    . BARIATRIC SURGERY    . CTR Bilateral 2005   had right hand done 1999  . TONSILLECTOMY      Social History  Substance Use Topics  . Smoking status: Never Smoker  . Smokeless tobacco: Never Used  . Alcohol use Yes     Comment: gin and wine 2x/month    Family History  Problem Relation Age of Onset  . Hypertension Mother   . Colon cancer Mother   . Colon polyps Mother   . Heart disease Father   .  Cancer Sister   . Colon cancer Sister   . Stroke Maternal Grandfather     No Known Allergies  Medication list has been reviewed and updated.  Current Outpatient Prescriptions on File Prior to Visit  Medication Sig Dispense Refill  . amLODipine (NORVASC) 5 MG tablet Take 1 tablet (5 mg total) by mouth daily. 30 tablet 3  . citalopram (CELEXA) 20 MG tablet Take 1 tablet (20 mg total) by mouth daily. (Patient not taking: Reported on 12/05/2016) 90 tablet 3  . lovastatin (MEVACOR) 20 MG tablet Take 1 tablet (20 mg total) by mouth at bedtime. (Patient not taking: Reported on 12/05/2016) 90 tablet 3  . Vitamin D, Ergocalciferol, (DRISDOL) 50000 units CAPS capsule Take 1 capsule (50,000 Units total) by mouth every 7 (seven) days. 12 capsule 0   No current facility-administered medications on file prior to visit.     Review of Systems:  As per HPI- otherwise negative.   Physical Examination: Vitals:   01/16/17 1114  BP: 140/84  Pulse: 71  Temp: 98.1 F (36.7 C)   Vitals:   01/16/17 1114  Weight: 182 lb (82.6 kg)  Height: 5' 3.5" (1.613 m)   Body mass index is 31.73  kg/m. Ideal Body Weight: Weight in (lb) to have BMI = 25: 143.1  GEN: WDWN, NAD, Non-toxic, A & O x 3, overweight, looks well HEENT: Atraumatic, Normocephalic. Neck supple. No masses, No LAD.  Bilateral TM wnl, oropharynx normal.  PEERL,EOMI.   No evidence of angioedema Ears and Nose: No external deformity. CV: RRR, No M/G/R. No JVD. No thrill. No extra heart sounds. PULM: CTA B, no wheezes, crackles, rhonchi. No retractions. No resp. distress. No accessory muscle use. EXTR: No c/c. She has soft edema of both feet which is baseline for her NEURO Normal gait.  PSYCH: Normally interactive. Conversant. Not depressed or anxious appearing.  Calm demeanor.  She has a few pruritic papules on her skin c/w urticaria. Right now they are just on her right wrist   Assessment and Plan: Essential hypertension - Plan:  losartan-hydrochlorothiazide (HYZAAR) 50-12.5 MG tablet  Itchy skin  Possible allergic reaction to amlodipine- will stop this medication She still needs BP control and would like to use a diuretic if we can for foot swelling rx for losartan hcrz- she will let me know how this works for her  It is possible that the amlodipine is causing the itchy spots on your skin- we will have you stop this medication and start losartan/ hctz for your blood pressure.  Take this new BP med once a day; however, you may certainly start with a 1/2 tablet and increase to a whole if your blood pressure is higher than 140/90.  Please let me know if your itchy spots do not resolve in the next few days!    Signed Lamar Blinks, MD

## 2017-03-21 ENCOUNTER — Other Ambulatory Visit: Payer: Self-pay | Admitting: Family Medicine

## 2017-03-21 DIAGNOSIS — E559 Vitamin D deficiency, unspecified: Secondary | ICD-10-CM

## 2017-09-22 ENCOUNTER — Other Ambulatory Visit: Payer: Self-pay | Admitting: Family Medicine

## 2017-09-22 DIAGNOSIS — I10 Essential (primary) hypertension: Secondary | ICD-10-CM

## 2017-09-25 ENCOUNTER — Ambulatory Visit (INDEPENDENT_AMBULATORY_CARE_PROVIDER_SITE_OTHER): Payer: Medicare Other | Admitting: Family Medicine

## 2017-09-25 ENCOUNTER — Encounter: Payer: Self-pay | Admitting: Family Medicine

## 2017-09-25 VITALS — BP 138/92 | HR 72 | Temp 98.3°F | Ht 63.5 in | Wt 184.4 lb

## 2017-09-25 DIAGNOSIS — E7800 Pure hypercholesterolemia, unspecified: Secondary | ICD-10-CM | POA: Diagnosis not present

## 2017-09-25 DIAGNOSIS — E2839 Other primary ovarian failure: Secondary | ICD-10-CM | POA: Diagnosis not present

## 2017-09-25 DIAGNOSIS — E785 Hyperlipidemia, unspecified: Secondary | ICD-10-CM | POA: Diagnosis not present

## 2017-09-25 DIAGNOSIS — Z1239 Encounter for other screening for malignant neoplasm of breast: Secondary | ICD-10-CM

## 2017-09-25 DIAGNOSIS — E559 Vitamin D deficiency, unspecified: Secondary | ICD-10-CM

## 2017-09-25 DIAGNOSIS — I10 Essential (primary) hypertension: Secondary | ICD-10-CM | POA: Diagnosis not present

## 2017-09-25 DIAGNOSIS — Z1231 Encounter for screening mammogram for malignant neoplasm of breast: Secondary | ICD-10-CM

## 2017-09-25 DIAGNOSIS — Z23 Encounter for immunization: Secondary | ICD-10-CM | POA: Diagnosis not present

## 2017-09-25 LAB — COMPREHENSIVE METABOLIC PANEL
ALK PHOS: 84 U/L (ref 39–117)
ALT: 13 U/L (ref 0–35)
AST: 16 U/L (ref 0–37)
Albumin: 3.8 g/dL (ref 3.5–5.2)
BUN: 24 mg/dL — AB (ref 6–23)
CO2: 26 meq/L (ref 19–32)
Calcium: 9.7 mg/dL (ref 8.4–10.5)
Chloride: 108 mEq/L (ref 96–112)
Creatinine, Ser: 0.76 mg/dL (ref 0.40–1.20)
GFR: 80 mL/min (ref >60.00)
GLUCOSE: 89 mg/dL (ref 70–99)
POTASSIUM: 4.4 meq/L (ref 3.5–5.1)
SODIUM: 140 meq/L (ref 135–145)
Total Bilirubin: 0.4 mg/dL (ref 0.2–1.2)
Total Protein: 6.9 g/dL (ref 6.0–8.3)

## 2017-09-25 LAB — CBC
HEMATOCRIT: 42 % (ref 36.0–46.0)
Hemoglobin: 14.1 g/dL (ref 12.0–15.0)
MCHC: 33.7 g/dL (ref 30.0–36.0)
MCV: 93.6 fl (ref 78.0–100.0)
Platelets: 208 10*3/uL (ref 150.0–400.0)
RBC: 4.48 Mil/uL (ref 3.87–5.11)
RDW: 13.8 % (ref 11.5–15.5)
WBC: 5 10*3/uL (ref 4.0–10.5)

## 2017-09-25 LAB — LIPID PANEL
CHOL/HDL RATIO: 4
Cholesterol: 233 mg/dL — ABNORMAL HIGH (ref 0–200)
HDL: 60.9 mg/dL (ref >39.00)
LDL Cholesterol: 143 mg/dL — ABNORMAL HIGH (ref 0–99)
NONHDL: 171.67
Triglycerides: 144 mg/dL (ref 0.0–149.0)
VLDL: 28.8 mg/dL (ref 0.0–40.0)

## 2017-09-25 LAB — VITAMIN D 25 HYDROXY (VIT D DEFICIENCY, FRACTURES): VITD: 19.27 ng/mL — ABNORMAL LOW (ref 30.00–100.00)

## 2017-09-25 MED ORDER — LOSARTAN POTASSIUM-HCTZ 50-12.5 MG PO TABS: 0.5000 | ORAL_TABLET | Freq: Every day | ORAL | 3 refills | Status: DC

## 2017-09-25 NOTE — Patient Instructions (Addendum)
It was good to see you today- I ordered a bone density scan and mamogram for you Will be in touch with your lab results asap  Start back on your 1/2 pill of the BP medication You may want to have the shingrix vaccine series to help prevent shingles - can be done at your pharmacy

## 2017-09-25 NOTE — Progress Notes (Addendum)
[name] at [facility] [address], [place], [place] [address] [phone]  Date:  09/25/2017   Name:  Rebecca Lopez   DOB:  12-09-1945   MRN:  [id number]  PCP:  Darreld Mclean, MD    Chief Complaint: Follow-up (Pt here for follow up visit. )   History of Present Illness:  Rebecca Lopez is a 72 y.o. very pleasant female patient who presents with the following:  Here today for a follow-up visit History of hyperlipidemia and HTN Last seen here in May  She states that she is trying to do better with her health  She did fall right before christmas and hit her head- she is better from this accident now, she then had the flu but recovered from that as well She tripped and fell while carrying things for the holidays  She is out of her BP med- just out for a couple of days She is not taking vitamin D right now- we will check this for her Stopped her celexa- this did not seem to help her so she stopped taking it. She feels like her mood is ok and she does not feel that she needs any medication for depression right now She never took the lovastatin- did not fill it, but would be willing to try this if her chl is still high  She is fasting today- we will do labs for her  Got her flu shot today  She is due for a mammogram She just needs a screening- will arrange for her, and a bone density as well Tues, thurs, fri are available to her   She has no concerns, is generally feeling well No CP or SOB  Patient Active Problem List   Diagnosis Date Noted  . Hyperlipidemia 10/04/2014  . Essential hypertension 10/04/2014  . Vitamin D deficiency 10/04/2014  . Family history of malignant neoplasm of gastrointestinal tract 03/10/2013    Past Medical History:  Diagnosis Date  . Allergy   . Arthritis   . Depression   . Hyperlipidemia     Past Surgical History:  Procedure Laterality Date  . ABDOMINAL HYSTERECTOMY    . APPENDECTOMY     . arm surgery    . BARIATRIC SURGERY    . CTR Bilateral 2005   had right hand done 1999  . TONSILLECTOMY      Social History   Tobacco Use  . Smoking status: Never Smoker  . Smokeless tobacco: Never Used  Substance Use Topics  . Alcohol use: Yes    Comment: gin and wine 2x/month  . Drug use: No    Family History  Problem Relation Age of Onset  . Hypertension Mother   . Colon cancer Mother   . Colon polyps Mother   . Heart disease Father   . Cancer Sister   . Colon cancer Sister   . Stroke Maternal Grandfather     Allergies  Allergen Reactions  . Amlodipine     Possible small itchy spots on skin    Medication list has been reviewed and updated.  Current Outpatient Medications on File Prior to Visit  Medication Sig Dispense Refill  . losartan-hydrochlorothiazide (HYZAAR) 50-12.5 MG tablet Take 1 tablet by mouth daily. (Patient taking differently: Take 0.5 tablets by mouth daily. ) 30 tablet 3  . citalopram (CELEXA) 20 MG tablet Take 1 tablet (20 mg total) by mouth daily. (Patient not taking: Reported on 09/25/2017)  90 tablet 3  . lovastatin (MEVACOR) 20 MG tablet Take 1 tablet (20 mg total) by mouth at bedtime. (Patient not taking: Reported on 09/25/2017) 90 tablet 3   No current facility-administered medications on file prior to visit.     Review of Systems:  As per HPI- otherwise negative.   Physical Examination: Vitals:   09/25/17 1051  BP: (!) 138/92  Pulse: 72  Temp: 98.3 F (36.8 C)  SpO2: 98%   Vitals:   09/25/17 1051  Weight: 184 lb 6.4 oz (83.6 kg)  Height: 5' 3.5" (1.613 m)   Body mass index is 32.15 kg/m. Ideal Body Weight: Weight in (lb) to have BMI = 25: 143.1  GEN: WDWN, NAD, Non-toxic, A & O x 3, overweight, looks well  HEENT: Atraumatic, Normocephalic. Neck supple. No masses, No LAD.  Bilateral TM wnl, oropharynx normal.  PEERL,EOMI.   Ears and Nose: No external deformity. CV: RRR, No M/G/R. No JVD. No thrill. No extra heart  sounds. PULM: CTA B, no wheezes, crackles, rhonchi. No retractions. No resp. distress. No accessory muscle use. ABD: S, NT, ND, +BS. No rebound. No HSM. EXTR: No c/c/e NEURO Normal gait.  PSYCH: Normally interactive. Conversant. Not depressed or anxious appearing.  Calm demeanor.    Assessment and Plan: Essential hypertension - Plan: losartan-hydrochlorothiazide (HYZAAR) 50-12.5 MG tablet, CBC, Comprehensive metabolic panel  Immunization due - Plan: Flu vaccine HIGH DOSE PF (Fluzone High dose)  Screening for breast cancer - Plan: MM SCREENING BREAST TOMO BILATERAL  Estrogen deficiency - Plan: DG Bone Density  Vitamin D deficiency - Plan: Vitamin D (25 hydroxy)  Hyperlipidemia, unspecified hyperlipidemia type - Plan: Lipid panel  Follow up visit today Labs pending as above Refilled her BP medication mammo and bone density ordered  Flu shot today  Signed Lamar Blinks, MD  Received her labs 1/27 and gave her a call Cholesterol is not favorable- will start her on lovastatin.  Will also treat with high dose vitamin D for 12 weeks.  Sent these 2 rx to her drug store.  Letter to pt as well   Results for orders placed or performed in visit on 09/25/17  CBC  Result Value Ref Range   WBC 5.0 4.0 - 10.5 K/uL   RBC 4.48 3.87 - 5.11 Mil/uL   Platelets 208.0 150.0 - 400.0 K/uL   Hemoglobin 14.1 12.0 - 15.0 g/dL   HCT 42.0 36.0 - 46.0 %   MCV 93.6 78.0 - 100.0 fl   MCHC 33.7 30.0 - 36.0 g/dL   RDW 13.8 11.5 - 15.5 %  Comprehensive metabolic panel  Result Value Ref Range   Sodium 140 135 - 145 mEq/L   Potassium 4.4 3.5 - 5.1 mEq/L   Chloride 108 96 - 112 mEq/L   CO2 26 19 - 32 mEq/L   Glucose, Bld 89 70 - 99 mg/dL   BUN 24 (H) 6 - 23 mg/dL   Creatinine, Ser 0.76 0.40 - 1.20 mg/dL   Total Bilirubin 0.4 0.2 - 1.2 mg/dL   Alkaline Phosphatase 84 39 - 117 U/L   AST 16 0 - 37 U/L   ALT 13 0 - 35 U/L   Total Protein 6.9 6.0 - 8.3 g/dL   Albumin 3.8 3.5 - 5.2 g/dL   Calcium 9.7  8.4 - 10.5 mg/dL   GFR 80.00 >60.00 mL/min  Lipid panel  Result Value Ref Range   Cholesterol 233 (H) 0 - 200 mg/dL   Triglycerides 144.0 0.0 - 149.0  mg/dL   HDL 60.90 >39.00 mg/dL   VLDL 28.8 0.0 - 40.0 mg/dL   LDL Cholesterol 143 (H) 0 - 99 mg/dL   Total CHOL/HDL Ratio 4    NonHDL 171.67   Vitamin D (25 hydroxy)  Result Value Ref Range   VITD 19.27 (L) 30.00 - 100.00 ng/mL   Estimated CV risk at 22%.

## 2017-09-28 MED ORDER — VITAMIN D (ERGOCALCIFEROL) 1.25 MG (50000 UNIT) PO CAPS: 50000.0000 [IU] | ORAL_CAPSULE | ORAL | 0 refills | Status: DC

## 2017-09-28 MED ORDER — LOVASTATIN 20 MG PO TABS: 20.0000 mg | ORAL_TABLET | Freq: Every day | ORAL | 3 refills | Status: DC

## 2017-09-28 NOTE — Addendum Note (Signed)
Addended by: Lamar Blinks C on: 09/28/2017 05:41 PM   Modules accepted: Orders

## 2017-09-30 ENCOUNTER — Ambulatory Visit (HOSPITAL_BASED_OUTPATIENT_CLINIC_OR_DEPARTMENT_OTHER)
Admission: RE | Admit: 2017-09-30 | Discharge: 2017-09-30 | Disposition: A | Discharge status: Home / Self Care | Payer: Medicare Other | Source: Ambulatory Visit | Attending: Family Medicine | Admitting: Family Medicine

## 2017-09-30 ENCOUNTER — Telehealth: Payer: Self-pay | Admitting: Family Medicine

## 2017-09-30 DIAGNOSIS — E2839 Other primary ovarian failure: Secondary | ICD-10-CM | POA: Diagnosis present

## 2017-09-30 DIAGNOSIS — M810 Age-related osteoporosis without current pathological fracture: Secondary | ICD-10-CM | POA: Insufficient documentation

## 2017-09-30 DIAGNOSIS — Z13820 Encounter for screening for osteoporosis: Secondary | ICD-10-CM | POA: Diagnosis present

## 2017-09-30 DIAGNOSIS — Z1231 Encounter for screening mammogram for malignant neoplasm of breast: Secondary | ICD-10-CM | POA: Diagnosis not present

## 2017-09-30 DIAGNOSIS — Z780 Asymptomatic menopausal state: Secondary | ICD-10-CM | POA: Diagnosis not present

## 2017-09-30 DIAGNOSIS — Z1239 Encounter for other screening for malignant neoplasm of breast: Secondary | ICD-10-CM

## 2017-09-30 NOTE — Telephone Encounter (Signed)
Received her bone density scan:  EXAM: DUAL X-RAY ABSORPTIOMETRY (DXA) FOR BONE MINERAL DENSITY  IMPRESSION: Referring Physician: Darreld Mclean PATIENT: Name: Rebecca Lopez, Rebecca Lopez Patient ID: [id number] Birth Date: [birth year] Height: 63.5 in. Sex: Female Measured: 09/30/2017 Weight: 184.4 lbs. Indications: Caucasian, Hysterectomy, Post Menopausal Fractures: Forearm Treatments: None Lap-Band Surgery ASSESSMENT: The BMD measured at AP Spine L1-L4 (L-3 was excluded due to degenerative changes) is 1.118 g/cm2 with a T-score of -0.4. This patient is considered normal according to [facility]) criteria.  The BMD measured at Femur Neck Left is 0.663 g/cm2 with a T-score of -2.7. This patient is considered osteoporotic according to [facility]) criteria.  The BMD measured at Femur Neck Right is 0.667 g/cm2 with a T-score of -2.7. This patient is considered osteoporotic according to [facility]) criteria. Site Region Measured Date Measured Age WHO YA BMD Classification T-score AP Spine L1-L4 (L3) 09/30/2017 69.8 Normal -0.4 1.118 g/cm2  DualFemur Neck Left 09/30/2017 69.8 [age] Osteoporosis -2.7 0.663 g/cm2  World Health Organization [facility]) criteria for post-menopausal, Caucasian Women: Normal    T-score at or above -1 SD Osteopenia  T-score between -1 and -2.5 SD Osteoporosis T-score at or below -2.5 SD RECOMMENDATION: [facility] recommends that FDA-approved medical therapies be considered in postmenopausal women and men age 74 or older with a: 1. Hip or vertebral (clinical or morphometric) fracture. 2. T-score of < -2.5 at the spine or hip. 3. Ten-year fracture probability by FRAX of 3% or greater for hip fracture or 20% or greater for major osteoporotic fracture. All treatment decisions require clinical judgment and consideration of individual patient factors, including patient  preferences, co-morbidities, previous drug use, risk factors not captured in the FRAX model (e.g. falls, vitamin D deficiency, increased bone turnover, interval significant decline in bone density) and possible under - or over-estimation of fracture risk by FRAX. All patients should ensure an adequate intake of dietary calcium (1200 mg/d) and vitamin D (800 IU daily) unless contraindicated. FOLLOW-UP: People with diagnosed cases of osteoporosis or at high risk for fracture should have regular bone mineral density tests. For patients eligible for Medicare, routine testing is allowed once every 2 years. The testing frequency can be increased to one year for patients who have rapidly progressing disease, those who are receiving or discontinuing medical therapy to restore bone mass, or have additional risk factors.  Called and LMOM- she does have osteoporosis, would recommend treatment.  Please let me know if ok! Will mail copy

## 2017-10-06 ENCOUNTER — Telehealth: Payer: Self-pay | Admitting: Family Medicine

## 2017-10-06 DIAGNOSIS — M810 Age-related osteoporosis without current pathological fracture: Secondary | ICD-10-CM

## 2017-10-06 MED ORDER — ALENDRONATE SODIUM 70 MG PO TABS: 70.0000 mg | ORAL_TABLET | ORAL | 11 refills | Status: DC

## 2017-10-06 NOTE — Telephone Encounter (Signed)
Called and discussed with her Will start on once a week fosamax, she will take calcium and vitamin D OTC, and we will plan to repeat a Dexa in about one year  No history of swallowing difficulty Calcium level normal Renal fxn normal    Meds ordered this encounter  Medications  . alendronate (FOSAMAX) 70 MG tablet    Sig: Take 1 tablet (70 mg total) by mouth every 7 (seven) days. Take with a full glass of water on an empty stomach.    Dispense:  4 tablet    Refill:  11

## 2017-10-06 NOTE — Telephone Encounter (Signed)
Copied from [place] [id number]. Topic: Quick Communication - See Telephone Encounter >> Oct 06, 2017  2:11 PM Vernona Rieger wrote: CRM for notification. See Telephone encounter for:   10/06/17.  Patient said she will be willing to take Bone Density Medication. She wants Dr Lorelei Pont to give her a call back Call back is [phone]

## 2017-10-17 NOTE — Telephone Encounter (Signed)
Called her- she did ok with her first dose, but after her 2nd dose of fosamax she had a lot of body pains.  She took advil and this resolved.  She is not sure if this was related, but it may be.  She will try once more and if body pains return we can try a new med for her

## 2017-10-17 NOTE — Telephone Encounter (Signed)
Please advise 

## 2017-10-17 NOTE — Telephone Encounter (Signed)
Pt called back in. Pt is requesting a call back from provider at: [phone]

## 2017-10-29 ENCOUNTER — Telehealth: Payer: Self-pay | Admitting: Family Medicine

## 2017-10-29 NOTE — Telephone Encounter (Signed)
FYI

## 2017-10-29 NOTE — Telephone Encounter (Signed)
Copied from [place]. Topic: Quick Communication - See Telephone Encounter >> Oct 29, 2017 10:32 AM Ivar Drape wrote: CRM for notification. See Telephone encounter for:  10/29/17. Patient expressed that she will not take the alendronate (FOSAMAX) 70 MG tablet medication anymore because it's making her bones ache.  Please advise as to what she should do from this point on.

## 2017-10-29 NOTE — Telephone Encounter (Signed)
Spoke with pt. She reports that her hands, feet and shoulder have been hurting a lot and states that her muscles are not sore but feels pain is coming from her bones. Pt has previously discussed this with PCP. Pt states she hasn't taken the medication since last week and pain is improving. Pt also thinks she does not have medication coverage under her new insurance through [employer]. She is going to contact insurance to verify medication coverage and potential alternatives to fosamax and let us know outcome.

## 2018-03-04 DIAGNOSIS — H2513 Age-related nuclear cataract, bilateral: Secondary | ICD-10-CM | POA: Diagnosis not present

## 2018-03-04 DIAGNOSIS — H5213 Myopia, bilateral: Secondary | ICD-10-CM | POA: Diagnosis not present

## 2018-03-04 DIAGNOSIS — H52223 Regular astigmatism, bilateral: Secondary | ICD-10-CM | POA: Diagnosis not present

## 2018-03-04 DIAGNOSIS — H524 Presbyopia: Secondary | ICD-10-CM | POA: Diagnosis not present

## 2018-04-01 ENCOUNTER — Encounter: Payer: Self-pay | Admitting: Family Medicine

## 2018-04-01 ENCOUNTER — Ambulatory Visit (HOSPITAL_BASED_OUTPATIENT_CLINIC_OR_DEPARTMENT_OTHER)
Admission: RE | Admit: 2018-04-01 | Discharge: 2018-04-01 | Disposition: A | Discharge status: Home / Self Care | Payer: Medicare Other | Source: Ambulatory Visit | Attending: Family Medicine | Admitting: Family Medicine

## 2018-04-01 ENCOUNTER — Ambulatory Visit (INDEPENDENT_AMBULATORY_CARE_PROVIDER_SITE_OTHER): Payer: Medicare Other | Admitting: Family Medicine

## 2018-04-01 ENCOUNTER — Ambulatory Visit: Payer: Self-pay

## 2018-04-01 VITALS — BP 140/80 | HR 92 | Temp 99.4°F | Ht 63.0 in | Wt 186.1 lb

## 2018-04-01 DIAGNOSIS — R1032 Left lower quadrant pain: Secondary | ICD-10-CM | POA: Diagnosis not present

## 2018-04-01 MED ORDER — METRONIDAZOLE 500 MG PO TABS: 500.0000 mg | ORAL_TABLET | Freq: Three times a day (TID) | ORAL | 0 refills | Status: AC

## 2018-04-01 MED ORDER — CIPROFLOXACIN HCL 500 MG PO TABS: 500.0000 mg | ORAL_TABLET | Freq: Two times a day (BID) | ORAL | 0 refills | Status: AC

## 2018-04-01 NOTE — Progress Notes (Signed)
Chief Complaint  Patient presents with  . Abdominal Pain    Subjective: Patient is a 72 y.o. female here for 5 d of abd pain.  LLQ abd pain. Radiates across abd. Nothing makes it better. Eating makes it worse. It is getting worse. No diarrhea, bleeding, vomiting, fevers, injury. +hx of diverticulosis.   ROS: Const: no fevers GI: As noted in HPI  Past Medical History:  Diagnosis Date  . Allergy   . Arthritis   . Depression   . Hyperlipidemia     Objective: BP 140/80 (BP Location: Left Arm, Patient Position: Sitting, Cuff Size: Large)   Pulse 92   Temp 99.4 F (37.4 C) (Oral)   Ht 5\' 3"  (1.6 m)   Wt 186 lb 2 oz (84.4 kg)   SpO2 97%   BMI 32.97 kg/m  General: Awake, appears stated age HEENT: MMM, EOMi Heart: RRR, no murmurs Lungs: CTAB, no rales, wheezes or rhonchi. No accessory muscle use Abd: Bowel sounds present, soft, tender to palpation diffusely, worse on the left side and even worse in the left lower quadrant.  Negative Murphy's, Carnetts, Rovsing's, McBurney's. Psych: Age appropriate judgment and insight, normal affect and mood  Assessment and Plan: Abdominal pain, acute, left lower quadrant - Plan: DG Abd 2 Views, metroNIDAZOLE (FLAGYL) 500 MG tablet, ciprofloxacin (CIPRO) 500 MG tablet  Given her worsening of symptoms, will treat empirically for diverticulitis.  I did get an x-ray to rule out constipation. No significant stool burden in area of ttp. Will stay with abx.  F/u next week if no improvement. Will consider CT vs GI referral. Warning signs and symptoms verbalized.  The patient voiced understanding and agreement to the plan.  Pal[place], DO 04/01/18  12:23 PM

## 2018-04-01 NOTE — Patient Instructions (Addendum)
Stay hydrated.  We will be in contact regarding your X-ray results.   Go to ER if things worsen.   Let us know if you need anything.

## 2018-04-01 NOTE — Telephone Encounter (Signed)
Pt. States she has had abdominal x 2 days, but " last night it suddenly got worse - now the pain is constant."  Pain is a "8" on 1-10 scale. Left lower quadrant of abdomen.Denies any fever, nausea,vomiting. Does report she has a lap band in place. Reports history of diverticulosis. Had been eating "a lot of nuts." Appointment made for this morning.  Reason for Disposition . Age > 60 years  Answer Assessment - Initial Assessment Questions 1. LOCATION: "Where does it hurt?"      Left lower quadrant of abd. 2. RADIATION: "Does the pain shoot anywhere else?" (e.g., chest, back)     To the back 3. ONSET: "When did the pain begin?" (e.g., minutes, hours or days ago)     Had been hurting for a couple of days , but has gotten worse 4. SUDDEN: "Gradual or sudden onset?"     Gradual 5. PATTERN "Does the pain come and go, or is it constant?"    - If constant: "Is it getting better, staying the same, or worsening?"      (Note: Constant means the pain never goes away completely; most serious pain is constant and it progresses)     - If intermittent: "How long does it last?" "Do you have pain now?"     (Note: Intermittent means the pain goes away completely between bouts)     Constant 6. SEVERITY: "How bad is the pain?"  (e.g., Scale 1-10; mild, moderate, or severe)   - MILD (1-3): doesn't interfere with normal activities, abdomen soft and not tender to touch    - MODERATE (4-7): interferes with normal activities or awakens from sleep, tender to touch    - SEVERE (8-10): excruciating pain, doubled over, unable to do any normal activities       8 7. RECURRENT SYMPTOM: "Have you ever had this type of abdominal pain before?" If so, ask: "When was the last time?" and "What happened that time?"      No 8. CAUSE: "What do you think is causing the abdominal pain?"     H/O Diverticulosis 9. RELIEVING/AGGRAVATING FACTORS: "What makes it better or worse?" (e.g., movement, antacids, bowel movement)     Nothing  helps 10. OTHER SYMPTOMS: "Has there been any vomiting, diarrhea, constipation, or urine problems?"       No 11. PREGNANCY: "Is there any chance you are pregnant?" "When was your last menstrual period?"       n/a  Protocols used: ABDOMINAL PAIN - [facility]

## 2018-05-22 ENCOUNTER — Ambulatory Visit: Payer: Self-pay

## 2018-05-22 NOTE — Telephone Encounter (Signed)
Pt. Reports she started having upper abdominal and chest pain - in July but it has become worse this week. Hurts worse if she eats "greasy food." Hurts down to her waist line and around to her back. States she is trying to watch her diet. Has not taken any OTC medications for this. Denies shortness of breath. Has nausea at times. Request appointment - appointment made. Instructed if symptoms worsen to go to ED. Verbalizes understanding. Reason for Disposition . Abdominal pain is a chronic symptom (recurrent or ongoing AND present > 4 weeks)  Answer Assessment - Initial Assessment Questions 1. LOCATION: "Where does it hurt?"      From upper chest down to her waist and around to back 2. RADIATION: "Does the pain shoot anywhere else?" (e.g., chest, back)     Burning in esophagus 3. ONSET: "When did the pain begin?" (e.g., minutes, hours or days ago)      Started in July but has gotten worse this week 4. SUDDEN: "Gradual or sudden onset?"     Gradual 5. PATTERN "Does the pain come and go, or is it constant?"    - If constant: "Is it getting better, staying the same, or worsening?"      (Note: Constant means the pain never goes away completely; most serious pain is constant and it progresses)     - If intermittent: "How long does it last?" "Do you have pain now?"     (Note: Intermittent means the pain goes away completely between bouts)     Pain is worse when she eats "greasy food" 6. SEVERITY: "How bad is the pain?"  (e.g., Scale 1-10; mild, moderate, or severe)   - MILD (1-3): doesn't interfere with normal activities, abdomen soft and not tender to touch    - MODERATE (4-7): interferes with normal activities or awakens from sleep, tender to touch    - SEVERE (8-10): excruciating pain, doubled over, unable to do any normal activities      Sometimes 6-7 or it can be a 3 7. RECURRENT SYMPTOM: "Have you ever had this type of abdominal pain before?" If so, ask: "When was the last time?" and "What  happened that time?"      No 8. CAUSE: "What do you think is causing the abdominal pain?"     Maybe my gallbladder 9. RELIEVING/AGGRAVATING FACTORS: "What makes it better or worse?" (e.g., movement, antacids, bowel movement)     No 10. OTHER SYMPTOMS: "Has there been any vomiting, diarrhea, constipation, or urine problems?"       Some nausea 11. PREGNANCY: "Is there any chance you are pregnant?" "When was your last menstrual period?"       no  Protocols used: ABDOMINAL PAIN - [facility]

## 2018-05-23 NOTE — Progress Notes (Deleted)
[place] at [facility] [address] [place], [place] [address] [phone]  Date:  05/25/2018   Name:  Rebecca Lopez   DOB:  [birth year]   MRN:  [id number]  PCP:  Darreld Mclean, MD    Chief Complaint: No chief complaint on file.   History of Present Illness:  Rebecca Lopez is a 72 y.o. very pleasant female patient who presents with the following:  Here today with concern of upper abdominal pain History of hyperlipidemia and HTN Last seen here 7/31 with LLQ pain, treated for possible diverticulitis by Dr. Nani Ravens   Colonoscopy is due- 5 year follow-up plan Flu: Pneumovax due as well   Patient Active Problem List   Diagnosis Date Noted  . Osteoporosis 09/30/2017  . Hyperlipidemia 10/04/2014  . Essential hypertension 10/04/2014  . Vitamin D deficiency 10/04/2014  . Family history of malignant neoplasm of gastrointestinal tract 03/10/2013    Past Medical History:  Diagnosis Date  . Allergy   . Arthritis   . Depression   . Hyperlipidemia     Past Surgical History:  Procedure Laterality Date  . ABDOMINAL HYSTERECTOMY    . APPENDECTOMY    . arm surgery    . BARIATRIC SURGERY    . CTR Bilateral 2005   had right hand done 1999  . TONSILLECTOMY      Social History   Tobacco Use  . Smoking status: Never Smoker  . Smokeless tobacco: Never Used  Substance Use Topics  . Alcohol use: Yes    Comment: gin and wine 2x/month  . Drug use: No    Family History  Problem Relation Age of Onset  . Hypertension Mother   . Colon cancer Mother   . Colon polyps Mother   . Heart disease Father   . Cancer Sister   . Colon cancer Sister   . Stroke Maternal Grandfather     Allergies  Allergen Reactions  . Amlodipine     Possible small itchy spots on skin    Medication list has been reviewed and updated.  Current Outpatient Medications on File Prior to Visit  Medication Sig Dispense Refill  . alendronate  (FOSAMAX) 70 MG tablet Take 1 tablet (70 mg total) by mouth every 7 (seven) days. Take with a full glass of water on an empty stomach. 4 tablet 11  . citalopram (CELEXA) 20 MG tablet Take 1 tablet (20 mg total) by mouth daily. 90 tablet 3  . losartan-hydrochlorothiazide (HYZAAR) 50-12.5 MG tablet Take 0.5 tablets by mouth daily. 45 tablet 3  . lovastatin (MEVACOR) 20 MG tablet Take 1 tablet (20 mg total) by mouth at bedtime. 90 tablet 3  . Vitamin D, Ergocalciferol, (DRISDOL) 50000 units CAPS capsule Take 1 capsule (50,000 Units total) by mouth every 7 (seven) days. 12 capsule 0   No current facility-administered medications on file prior to visit.     Review of Systems:  As per HPI- otherwise negative.   Physical Examination: There were no vitals filed for this visit. There were no vitals filed for this visit. There is no height or weight on file to calculate BMI. Ideal Body Weight:    GEN: WDWN, NAD, Non-toxic, A & O x 3 HEENT: Atraumatic, Normocephalic. Neck supple. No masses, No LAD. Ears and Nose: No external deformity. CV: RRR, No M/G/R. No JVD. No thrill. No extra heart sounds. PULM: CTA B, no wheezes, crackles, rhonchi. No retractions. No  resp. distress. No accessory muscle use. ABD: S, NT, ND, +BS. No rebound. No HSM. EXTR: No c/c/e NEURO Normal gait.  PSYCH: Normally interactive. Conversant. Not depressed or anxious appearing.  Calm demeanor.    Assessment and Plan: ***  Signed Lamar Blinks, MD

## 2018-05-24 ENCOUNTER — Encounter (HOSPITAL_COMMUNITY): Payer: Self-pay | Admitting: *Deleted

## 2018-05-24 ENCOUNTER — Emergency Department (HOSPITAL_COMMUNITY)
Admission: EM | Admit: 2018-05-24 | Discharge: 2018-05-25 | Disposition: A | Discharge status: Home / Self Care | Payer: Medicare Other | Attending: Emergency Medicine | Admitting: Emergency Medicine

## 2018-05-24 ENCOUNTER — Other Ambulatory Visit: Payer: Self-pay

## 2018-05-24 DIAGNOSIS — N3000 Acute cystitis without hematuria: Secondary | ICD-10-CM | POA: Diagnosis not present

## 2018-05-24 DIAGNOSIS — K269 Duodenal ulcer, unspecified as acute or chronic, without hemorrhage or perforation: Secondary | ICD-10-CM | POA: Diagnosis not present

## 2018-05-24 DIAGNOSIS — Z79899 Other long term (current) drug therapy: Secondary | ICD-10-CM | POA: Diagnosis not present

## 2018-05-24 DIAGNOSIS — R1013 Epigastric pain: Secondary | ICD-10-CM | POA: Diagnosis present

## 2018-05-24 DIAGNOSIS — I10 Essential (primary) hypertension: Secondary | ICD-10-CM | POA: Insufficient documentation

## 2018-05-24 DIAGNOSIS — K5730 Diverticulosis of large intestine without perforation or abscess without bleeding: Secondary | ICD-10-CM | POA: Diagnosis not present

## 2018-05-24 DIAGNOSIS — R109 Unspecified abdominal pain: Secondary | ICD-10-CM | POA: Diagnosis not present

## 2018-05-24 LAB — COMPREHENSIVE METABOLIC PANEL
ALT: 16 U/L (ref 0–44)
ANION GAP: 7 (ref 5–15)
AST: 20 U/L (ref 15–41)
Albumin: 3.7 g/dL (ref 3.5–5.0)
Alkaline Phosphatase: 71 U/L (ref 38–126)
BILIRUBIN TOTAL: 0.7 mg/dL (ref 0.3–1.2)
BUN: 13 mg/dL (ref 8–23)
CHLORIDE: 108 mmol/L (ref 98–111)
CO2: 21 mmol/L — ABNORMAL LOW (ref 22–32)
Calcium: 9.4 mg/dL (ref 8.9–10.3)
Creatinine, Ser: 0.81 mg/dL (ref 0.44–1.00)
GFR calc Af Amer: >60 mL/min (ref >60)
GFR calc non Af Amer: >60 mL/min (ref >60)
GLUCOSE: 92 mg/dL (ref 70–99)
POTASSIUM: 3.4 mmol/L — AB (ref 3.5–5.1)
SODIUM: 136 mmol/L (ref 135–145)
TOTAL PROTEIN: 6.8 g/dL (ref 6.5–8.1)

## 2018-05-24 LAB — LIPASE, BLOOD: LIPASE: 30 U/L (ref 11–51)

## 2018-05-24 LAB — CBC
HEMATOCRIT: 44.7 % (ref 36.0–46.0)
HEMOGLOBIN: 14.5 g/dL (ref 12.0–15.0)
MCH: 31.5 pg (ref 26.0–34.0)
MCHC: 32.4 g/dL (ref 30.0–36.0)
MCV: 97 fL (ref 78.0–100.0)
Platelets: 215 10*3/uL (ref 150–400)
RBC: 4.61 MIL/uL (ref 3.87–5.11)
RDW: 13.6 % (ref 11.5–15.5)
WBC: 8.7 10*3/uL (ref 4.0–10.5)

## 2018-05-24 NOTE — ED Triage Notes (Signed)
The pt is c/o epigastric pain for 4-5 days with nausea.  Lap band surgery 9  Years ago

## 2018-05-25 ENCOUNTER — Telehealth: Payer: Self-pay

## 2018-05-25 ENCOUNTER — Encounter (HOSPITAL_COMMUNITY): Payer: Self-pay | Admitting: Emergency Medicine

## 2018-05-25 ENCOUNTER — Ambulatory Visit: Payer: Medicare Other | Admitting: Family Medicine

## 2018-05-25 ENCOUNTER — Emergency Department (HOSPITAL_COMMUNITY): Payer: Medicare Other

## 2018-05-25 DIAGNOSIS — K269 Duodenal ulcer, unspecified as acute or chronic, without hemorrhage or perforation: Secondary | ICD-10-CM | POA: Diagnosis not present

## 2018-05-25 DIAGNOSIS — K5730 Diverticulosis of large intestine without perforation or abscess without bleeding: Secondary | ICD-10-CM | POA: Diagnosis not present

## 2018-05-25 LAB — URINALYSIS, ROUTINE W REFLEX MICROSCOPIC
BILIRUBIN URINE: NEGATIVE
Glucose, UA: NEGATIVE mg/dL
HGB URINE DIPSTICK: NEGATIVE
Ketones, ur: 20 mg/dL — AB
NITRITE: NEGATIVE
Protein, ur: NEGATIVE mg/dL
Specific Gravity, Urine: 1.008 (ref 1.005–1.030)
pH: 5 (ref 5.0–8.0)

## 2018-05-25 LAB — I-STAT TROPONIN, ED: Troponin i, poc: 0 ng/mL (ref 0.00–0.08)

## 2018-05-25 MED ORDER — DICYCLOMINE HCL 10 MG/ML IM SOLN
20.0000 mg | Freq: Once | INTRAMUSCULAR | Status: AC
  Administered 2018-05-25: 20 mg via INTRAMUSCULAR
  Filled 2018-05-25: qty 2

## 2018-05-25 MED ORDER — FENTANYL CITRATE (PF) 100 MCG/2ML IJ SOLN
50.0000 ug | Freq: Once | INTRAMUSCULAR | Status: AC
  Administered 2018-05-25: 50 ug via INTRAVENOUS
  Filled 2018-05-25: qty 2

## 2018-05-25 MED ORDER — CEPHALEXIN 500 MG PO CAPS: 500.0000 mg | ORAL_CAPSULE | Freq: Four times a day (QID) | ORAL | 0 refills | Status: DC

## 2018-05-25 MED ORDER — GI COCKTAIL ~~LOC~~
30.0000 mL | Freq: Once | ORAL | Status: AC
  Administered 2018-05-25: 30 mL via ORAL
  Filled 2018-05-25: qty 30

## 2018-05-25 MED ORDER — IOHEXOL 300 MG/ML  SOLN
100.0000 mL | Freq: Once | INTRAMUSCULAR | Status: AC | PRN
  Administered 2018-05-25: 100 mL via INTRAVENOUS

## 2018-05-25 MED ORDER — SUCRALFATE 1 GM/10ML PO SUSP: 1.0000 g | Freq: Three times a day (TID) | ORAL | 0 refills | Status: DC

## 2018-05-25 MED ORDER — PANTOPRAZOLE SODIUM 40 MG PO TBEC
40.0000 mg | DELAYED_RELEASE_TABLET | Freq: Once | ORAL | Status: AC
  Administered 2018-05-25: 40 mg via ORAL
  Filled 2018-05-25: qty 1

## 2018-05-25 MED ORDER — CEPHALEXIN 250 MG PO CAPS
500.0000 mg | ORAL_CAPSULE | Freq: Once | ORAL | Status: AC
  Administered 2018-05-25: 500 mg via ORAL
  Filled 2018-05-25: qty 2

## 2018-05-25 MED ORDER — SUCRALFATE 1 G PO TABS: 1.0000 g | ORAL_TABLET | Freq: Three times a day (TID) | ORAL | 0 refills | Status: DC

## 2018-05-25 MED ORDER — PANTOPRAZOLE SODIUM 40 MG PO TBEC: 40.0000 mg | DELAYED_RELEASE_TABLET | Freq: Every day | ORAL | 0 refills | Status: DC

## 2018-05-25 NOTE — ED Provider Notes (Signed)
[facility] EMERGENCY DEPARTMENT Provider Note   CSN: [id number] Arrival date & time: 05/24/18  1900     History   Chief Complaint Chief Complaint  Patient presents with  . Abdominal Pain    HPI Rebecca Lopez is a 72 y.o. female.  The history is provided by the patient.  Abdominal Pain   This is a new problem. The current episode started more than 2 days ago (Thursday). The problem occurs constantly. The problem has been gradually worsening. The pain is associated with an unknown factor. The pain is located in the LUQ, RUQ, epigastric region and periumbilical region. The pain is at a severity of 9/10. The pain is severe. Associated symptoms include flatus and nausea. Pertinent negatives include fever, diarrhea, dysuria, frequency and hematuria. Nothing aggravates the symptoms. Nothing relieves the symptoms. Past workup includes surgery. Her past medical history does not include ulcerative colitis.    Past Medical History:  Diagnosis Date  . Allergy   . Arthritis   . Depression   . Hyperlipidemia     Patient Active Problem List   Diagnosis Date Noted  . Osteoporosis 09/30/2017  . Hyperlipidemia 10/04/2014  . Essential hypertension 10/04/2014  . Vitamin D deficiency 10/04/2014  . Family history of malignant neoplasm of gastrointestinal tract 03/10/2013    Past Surgical History:  Procedure Laterality Date  . ABDOMINAL HYSTERECTOMY    . APPENDECTOMY    . arm surgery    . BARIATRIC SURGERY    . CTR Bilateral 2005   had right hand done 1999  . TONSILLECTOMY       OB History   None      Home Medications    Prior to Admission medications   Medication Sig Start Date End Date Taking? Authorizing Provider  alendronate (FOSAMAX) 70 MG tablet Take 1 tablet (70 mg total) by mouth every 7 (seven) days. Take with a full glass of water on an empty stomach. 10/06/17   Copland, Gay Filler, MD  citalopram (CELEXA) 20 MG tablet Take 1 tablet (20 mg total) by  mouth daily. 10/31/16   Copland, Gay Filler, MD  losartan-hydrochlorothiazide (HYZAAR) 50-12.5 MG tablet Take 0.5 tablets by mouth daily. 09/25/17   Copland, Gay Filler, MD  lovastatin (MEVACOR) 20 MG tablet Take 1 tablet (20 mg total) by mouth at bedtime. 09/28/17   Copland, Gay Filler, MD  Vitamin D, Ergocalciferol, (DRISDOL) 50000 units CAPS capsule Take 1 capsule (50,000 Units total) by mouth every 7 (seven) days. 09/28/17   Copland, Gay Filler, MD    Family History Family History  Problem Relation Age of Onset  . Hypertension Mother   . Colon cancer Mother   . Colon polyps Mother   . Heart disease Father   . Cancer Sister   . Colon cancer Sister   . Stroke Maternal Grandfather     Social History Social History   Tobacco Use  . Smoking status: Never Smoker  . Smokeless tobacco: Never Used  Substance Use Topics  . Alcohol use: Yes    Comment: gin and wine 2x/month  . Drug use: No     Allergies   Amlodipine   Review of Systems Review of Systems  Constitutional: Negative for diaphoresis and fever.  Respiratory: Negative for shortness of breath.   Cardiovascular: Negative for chest pain, palpitations and leg swelling.  Gastrointestinal: Positive for abdominal pain, flatus and nausea. Negative for diarrhea.  Genitourinary: Negative for dysuria, flank pain, frequency and hematuria.  All  other systems reviewed and are negative.    Physical Exam Updated Vital Signs BP (!) 160/86 (BP Location: Right Arm)   Pulse 68   Temp 98.2 F (36.8 C) (Oral)   Resp 16   Ht 5\' 3"  (1.6 m)   Wt 80.7 kg   SpO2 98%   BMI 31.53 kg/m   Physical Exam  Constitutional: She is oriented to person, place, and time. She appears well-developed and well-nourished. No distress.  HENT:  Head: Normocephalic and atraumatic.  Mouth/Throat: No oropharyngeal exudate.  Eyes: Pupils are equal, round, and reactive to light. Conjunctivae are normal.  Neck: Normal range of motion. Neck supple.    Cardiovascular: Normal rate, regular rhythm, normal heart sounds and intact distal pulses.  Pulmonary/Chest: Effort normal and breath sounds normal. No stridor. She has no wheezes. She has no rales.  Abdominal: Normal appearance. She exhibits no mass. Bowel sounds are increased. There is no tenderness. There is no rigidity, no rebound, no guarding, no tenderness at McBurney's point and negative Murphy's sign. No hernia.  Musculoskeletal: Normal range of motion.  Neurological: She is alert and oriented to person, place, and time. She displays normal reflexes.  Skin: Skin is warm and dry. Capillary refill takes less than 2 seconds.  Psychiatric: She has a normal mood and affect.     ED Treatments / Results  Labs (all labs ordered are listed, but only abnormal results are displayed) Results for orders placed or performed during the hospital encounter of 05/24/18  Lipase, blood  Result Value Ref Range   Lipase 30 11 - 51 U/L  Comprehensive metabolic panel  Result Value Ref Range   Sodium 136 135 - 145 mmol/L   Potassium 3.4 (L) 3.5 - 5.1 mmol/L   Chloride 108 98 - 111 mmol/L   CO2 21 (L) 22 - 32 mmol/L   Glucose, Bld 92 70 - 99 mg/dL   BUN 13 8 - 23 mg/dL   Creatinine, Ser 0.81 0.44 - 1.00 mg/dL   Calcium 9.4 8.9 - 10.3 mg/dL   Total Protein 6.8 6.5 - 8.1 g/dL   Albumin 3.7 3.5 - 5.0 g/dL   AST 20 15 - 41 U/L   ALT 16 0 - 44 U/L   Alkaline Phosphatase 71 38 - 126 U/L   Total Bilirubin 0.7 0.3 - 1.2 mg/dL   GFR calc non Af Amer >60 >60 mL/min   GFR calc Af Amer >60 >60 mL/min   Anion gap 7 5 - 15  CBC  Result Value Ref Range   WBC 8.7 4.0 - 10.5 K/uL   RBC 4.61 3.87 - 5.11 MIL/uL   Hemoglobin 14.5 12.0 - 15.0 g/dL   HCT 44.7 36.0 - 46.0 %   MCV 97.0 78.0 - 100.0 fL   MCH 31.5 26.0 - 34.0 pg   MCHC 32.4 30.0 - 36.0 g/dL   RDW 13.6 11.5 - 15.5 %   Platelets 215 150 - 400 K/uL  Urinalysis, Routine w reflex microscopic  Result Value Ref Range   Color, Urine YELLOW YELLOW    APPearance CLEAR CLEAR   Specific Gravity, Urine 1.008 1.005 - 1.030   pH 5.0 5.0 - 8.0   Glucose, UA NEGATIVE NEGATIVE mg/dL   Hgb urine dipstick NEGATIVE NEGATIVE   Bilirubin Urine NEGATIVE NEGATIVE   Ketones, ur 20 (A) NEGATIVE mg/dL   Protein, ur NEGATIVE NEGATIVE mg/dL   Nitrite NEGATIVE NEGATIVE   Leukocytes, UA MODERATE (A) NEGATIVE   RBC / HPF 0-5  0 - 5 RBC/hpf   WBC, UA 21-50 0 - 5 WBC/hpf   Bacteria, UA RARE (A) NONE SEEN   Squamous Epithelial / LPF 0-5 0 - 5   Mucus PRESENT    No results found.  EKG EKG Interpretation  Date/Time:  Sunday May 24 2018 20:25:44 EDT Ventricular Rate:  74 PR Interval:  194 QRS Duration: 74 QT Interval:  400 QTC Calculation: 444 R Axis:   -9 Text Interpretation:  Normal sinus rhythm Nonspecific ST abnormality Confirmed by Randal Buba, Aubra Pappalardo ([id number]) on 05/25/2018 12:43:05 AM   Radiology No results found.  Procedures Procedures (including critical care time)  Medications Ordered in ED Medications  gi cocktail (Maalox,Lidocaine,Donnatal) (has no administration in time range)     Case d/w with Dr. Hulen Skains of surgery.  No need for surgical intervention.  Patient can be scoped as an outpatient by GI.  Follow up with her bariatric surgeon as an outpatient     Patient states she has not had this before but notes in Epic state patient has seen PMD in the past for this.    Final Clinical Impressions(s) / ED Diagnoses    Patient is advised to follow up with her PMD and GI for endoscopy and H Pylori testing.  Patient is concerned now that this is still "not what is causing her pain and it will come back again."    Return for fevers >100.4 unrelieved by medication, shortness of breath, intractable vomiting, or diarrhea, Inability to tolerate liquids or food, cough, altered mental status or any concerns. No signs of systemic illness or infection. The patient is nontoxic-appearing on exam and vital signs are within normal limits.   I  have reviewed the triage vital signs and the nursing notes. Pertinent labs &imaging results that were available during my care of the patient were reviewed by me and considered in my medical decision making (see chart for details).  After history, exam, and medical workup I feel the patient has been appropriately medically screened and is safe for discharge home. Pertinent diagnoses were discussed with the patient. Patient was given return precautions.      Aletheia Tangredi, MD 05/25/18 [phone]

## 2018-05-25 NOTE — Addendum Note (Signed)
Addended by: Lamar Blinks C on: 05/25/2018 08:04 PM   Modules accepted: Orders

## 2018-05-25 NOTE — ED Notes (Signed)
Taken to CT at this time. 

## 2018-05-25 NOTE — Telephone Encounter (Signed)
ED follow up call made to patient . States she will be seeing her Bariatric Dr. And Gastroenterology regarding her ED visit.

## 2018-05-25 NOTE — Telephone Encounter (Signed)
Please give her a call- I changed her rx to the carafate tablet which I think is less expensive.  Let me know if anything else is needed  Meds ordered this encounter  Medications  . sucralfate (CARAFATE) 1 g tablet    Sig: Take 1 tablet (1 g total) by mouth 4 (four) times daily -  with meals and at bedtime.    Dispense:  40 tablet    Refill:  0

## 2018-05-25 NOTE — Telephone Encounter (Signed)
Pt called back.  States ED gave her a Carafate RX, but without insurance it is going to be over $200 and she wants to know if there is anything else the doctor would recommend.

## 2018-05-26 LAB — URINE CULTURE: SPECIAL REQUESTS: NORMAL

## 2018-07-07 NOTE — Progress Notes (Signed)
[place] at [facility] [address], [place], [place] [address] [phone]  Date:  07/08/2018   Name:  Rebecca Lopez   DOB:  1946-04-01   MRN:  [id number]  PCP:  Darreld Mclean, MD    Chief Complaint: Medication Management and Flu Vaccine   History of Present Illness:  Rebecca Lopez is a 72 y.o. very pleasant female patient who presents with the following:  History of hyperlipidemia, HTN, vitamin D def and osteoporosis Would like to discuss medications today We tried fosamax for her earlier this year but she really could not tolerate.  It gave her severe body pains. She would like to try a different class of med-perhaps Prolia. However at this time she does not have any rx drug coverage on her insurance plan.  She will be changing this in January  dexa 1/19: The BMD measured at Femur Neck Left is 0.663 g/cm2 with a T-score of -2.7. This patient is considered osteoporotic according to [facility]) criteria.  She was seen in the ER in July and was thought to have a possible duodenal ulcer- she had a CT scan  IMPRESSION: 1. Focal area of ulceration versus less likely diverticula along the medial wall of the second portion of the duodenum with associated inflammatory changes. No bowel obstruction. 2. Extensive colonic diverticulosis.  She did take 30 days of a PPI following her ER visit  At this time she does not have much in the way of GI sx, may feel a little discomfort if she drinks a coke. She is not sure if it is worth seeing GI.  However, she is due for a colonoscopy anyway, so we will refer her for a consultation. She may want to have colon and UGI at the same time In the meantime she will let me know if any concerning sx return  Flu shot today  We are not sure if she has had pneumovax- she is ok with getting this today  She is taking only her BP med now Stopped celexa and also her lovastatin Her  mood seems to be good Did not tolerate the statin very well   Patient Active Problem List   Diagnosis Date Noted  . Osteoporosis 09/30/2017  . Hyperlipidemia 10/04/2014  . Essential hypertension 10/04/2014  . Vitamin D deficiency 10/04/2014  . Family history of malignant neoplasm of gastrointestinal tract 03/10/2013    Past Medical History:  Diagnosis Date  . Allergy   . Arthritis   . Depression   . Hyperlipidemia     Past Surgical History:  Procedure Laterality Date  . ABDOMINAL HYSTERECTOMY    . APPENDECTOMY    . arm surgery    . BARIATRIC SURGERY    . CTR Bilateral 2005   had right hand done 1999  . TONSILLECTOMY      Social History   Tobacco Use  . Smoking status: Never Smoker  . Smokeless tobacco: Never Used  Substance Use Topics  . Alcohol use: Yes    Comment: gin and wine 2x/month  . Drug use: No    Family History  Problem Relation Age of Onset  . Hypertension Mother   . Colon cancer Mother   . Colon polyps Mother   . Heart disease Father   . Cancer Sister   . Colon cancer Sister   . Stroke Maternal Grandfather     Allergies  Allergen Reactions  .  Amlodipine     Possible small itchy spots on skin    Medication list has been reviewed and updated.  Current Outpatient Medications on File Prior to Visit  Medication Sig Dispense Refill  . losartan-hydrochlorothiazide (HYZAAR) 50-12.5 MG tablet Take 0.5 tablets by mouth daily. 45 tablet 3  . cholecalciferol (VITAMIN D) 1000 units tablet Take 1,000 Units by mouth daily.     No current facility-administered medications on file prior to visit.     Review of Systems:  As per HPI- otherwise negative. She has no fevers or chills No recurrent CP, no SOB    Physical Examination: Vitals:   07/08/18 1551  BP: 122/82  Pulse: 91  Resp: 16  Temp: 97.9 F (36.6 C)  SpO2: 99%   Vitals:   07/08/18 1551  Weight: 184 lb 12.8 oz (83.8 kg)  Height: 5\' 3"  (1.6 m)   Body mass index is 32.74  kg/m. Ideal Body Weight: Weight in (lb) to have BMI = 25: 140.8  GEN: WDWN, NAD, Non-toxic, A & O x 3, obese, looks well  HEENT: Atraumatic, Normocephalic. Neck supple. No masses, No LAD. Ears and Nose: No external deformity. CV: RRR, No M/G/R. No JVD. No thrill. No extra heart sounds. PULM: CTA B, no wheezes, crackles, rhonchi. No retractions. No resp. distress. No accessory muscle use. EXTR: No c/c/e NEURO Normal gait.  PSYCH: Normally interactive. Conversant. Not depressed or anxious appearing.  Calm demeanor.    Assessment and Plan: Screening for colon cancer - Plan: Ambulatory referral to Gastroenterology  Immunization due - Plan: Pneumococcal polysaccharide vaccine 23-valent greater than or equal to 70yo subcutaneous/IM  Duodenal ulcer - Plan: Ambulatory referral to Gastroenterology  Need for influenza vaccination - Plan: Flu vaccine HIGH DOSE PF (Fluzone High dose)  Osteoporosis without current pathological fracture, unspecified osteoporosis type  Essential hypertension  BP is under ok control on current med regimen  Updated immun today Referral back to GI She will contact me once her rx drug plan is set up and we can start prolia for her at that time  Signed Lamar Blinks, MD

## 2018-07-08 ENCOUNTER — Ambulatory Visit (INDEPENDENT_AMBULATORY_CARE_PROVIDER_SITE_OTHER): Payer: Medicare Other | Admitting: Family Medicine

## 2018-07-08 ENCOUNTER — Encounter: Payer: Self-pay | Admitting: Family Medicine

## 2018-07-08 VITALS — BP 122/82 | HR 91 | Temp 97.9°F | Resp 16 | Ht 63.0 in | Wt 184.8 lb

## 2018-07-08 DIAGNOSIS — I10 Essential (primary) hypertension: Secondary | ICD-10-CM

## 2018-07-08 DIAGNOSIS — Z1211 Encounter for screening for malignant neoplasm of colon: Secondary | ICD-10-CM

## 2018-07-08 DIAGNOSIS — K269 Duodenal ulcer, unspecified as acute or chronic, without hemorrhage or perforation: Secondary | ICD-10-CM

## 2018-07-08 DIAGNOSIS — M810 Age-related osteoporosis without current pathological fracture: Secondary | ICD-10-CM

## 2018-07-08 DIAGNOSIS — Z23 Encounter for immunization: Secondary | ICD-10-CM | POA: Diagnosis not present

## 2018-07-08 NOTE — Patient Instructions (Addendum)
It was good to see you today- you got your flu shot and a dose of pneumonia vaccine today I am going to set you up to see GI- you are due for colonoscopy anyway, and you can discuss possible ulcer with them as well Please send me a message or call in January once you get your drug insurance set up- then we can work on getting you set with Prolia for your bones

## 2018-07-13 ENCOUNTER — Telehealth: Payer: Self-pay | Admitting: Family Medicine

## 2018-07-13 ENCOUNTER — Encounter: Payer: Self-pay | Admitting: Gastroenterology

## 2018-07-13 NOTE — Telephone Encounter (Signed)
Copied from [place] [phone]. Topic: Quick Communication - See Telephone Encounter >> Jul 13, 2018  4:44 PM Blase Mess A wrote: CRM for notification. See Telephone encounter for: 07/13/18.  Patient is calling to request notes from when patient was in the Er. Patient had that scan in the Er. Wanted to know more information about the scan.  The ER told her that is was Dual abdomical ulcer.  Does Dr. Edilia Bo think that pateint needs further medical attention for the dual abdomical ulcer because she is interested in changing insurance? Please advise Patient call back # [phone]

## 2018-07-13 NOTE — Telephone Encounter (Signed)
Wrong office - Sending to right now

## 2018-07-13 NOTE — Telephone Encounter (Signed)
Pleases give her a call-  We had discussed this at our visit a week ago and decided to have her see GI to check on this potential ulcer as she is also due for colonoscopy anyway. The CT scan said that she might have an ulcer, they could not tell for certain.  The only way to know for sure if there is any ulcer  would be to have an upper GI scope   Does she have any other question? I am not sure what she is asking  We had already decided on a GI referral.  If she does not wish to do this she does not have to, but I would recommend it

## 2018-07-14 NOTE — Telephone Encounter (Signed)
Called patient, I am not exactly sure what she is wanting clinically on our end. This sounds like more of an insurance problem/question. She has an appointment with GI, earliest appt offered was 08/24/18. She plans on going to see them to have scope and colonoscopy. She is afraid there will be something found at this appointment more than a ulcer or diverticulitis. She is worried about a tumor. When she was at the ER she was told it was definetly a ulcer, but when she was pcp she was told it could be diverticulitis which she has never had. With the possibilty it is not ulcer, and this issue is not cured with antibioitcs she does not want to change insurances and not have the full benefits of treatment if it does turn out to a tumor. She states she is just trying to be proactive.

## 2018-08-10 ENCOUNTER — Ambulatory Visit (AMBULATORY_SURGERY_CENTER): Payer: Self-pay | Admitting: *Deleted

## 2018-08-10 ENCOUNTER — Telehealth: Payer: Self-pay | Admitting: *Deleted

## 2018-08-10 VITALS — Ht 63.0 in | Wt 185.0 lb

## 2018-08-10 DIAGNOSIS — Z800 Family history of malignant neoplasm of digestive organs: Secondary | ICD-10-CM

## 2018-08-10 DIAGNOSIS — Z86010 Personal history of colonic polyps: Secondary | ICD-10-CM

## 2018-08-10 MED ORDER — NA SULFATE-K SULFATE-MG SULF 17.5-3.13-1.6 GM/177ML PO SOLN: 1.0000 | Freq: Once | ORAL | 0 refills | Status: AC

## 2018-08-10 NOTE — Telephone Encounter (Signed)
She can be schedule for colon and EGD. They can be done together or separately. I rechecked my [place] December schedule which is full, including 15 procedures scheduled on 12/23, so I cannot add on an EGD on 12/23. I understand her problem with the deductible and her desire to have the procedures done on the same day. These are common concerns.  Please offer her to see one of my partners for colonoscopy/EGD if they have a double opening in [place] in December that works for her otherwise she can decide which appointment I have available for her.

## 2018-08-10 NOTE — Telephone Encounter (Signed)
Spoke with pt- explained to her Dr Fuller Plan said we can do ECL but he is totally booked and that another provider could do these if she's ok with that- pt is ok to have it this way to get in before the end of the year and to be able to do the procedures together-  She is now scheduled for Friday 12-13 at 230 pm with Dr Bryan Lemma- pt will come by 3rd floor today and pick up new instructions  -  Lelan Pons PV

## 2018-08-10 NOTE — Telephone Encounter (Signed)
Dr Fuller Plan,  I am seeing this pt in PV today - she saw ED 05-24-2018 and was dx'd with a duodenal ulcer- she is scheduled for a colon with you 12-23 Monday as a hx of colon polyps, [facility] [place]- She is asking to add an EGD to her colon-  She wants it before the end of the year- I looked at your schedule and the first time you have for a double is 09-07-2018--  She does not want to be put to sleep twice if she doesn't have to be-  Her deductible will go up after the first of the yr- she states she called and asked about adding an EGD and was told to just discuss it at her PV-   Please advise on whether she needs an OV with you before she has an EGD or can she be booked direct for a double.   Thanks for your time, Lelan Pons

## 2018-08-10 NOTE — Telephone Encounter (Signed)
Called pt- LMTRC to Me in [place] today

## 2018-08-10 NOTE — Progress Notes (Signed)
No egg or soy allergy known to patient  No issues with past sedation with any surgeries  or procedures, no intubation problems  No diet pills per patient No home 02 use per patient  No blood thinners per patient  Pt denies issues with constipation  No A fib or A flutter  EMMI video sent to pt's e mail  In September pt was dx'd with a duodenal ulcer - she wants to have an EGD- note to stark - ? Ov

## 2018-08-14 ENCOUNTER — Encounter: Payer: Self-pay | Admitting: Gastroenterology

## 2018-08-14 ENCOUNTER — Ambulatory Visit (AMBULATORY_SURGERY_CENTER): Payer: Medicare Other | Admitting: Gastroenterology

## 2018-08-14 VITALS — BP 111/58 | HR 61 | Temp 97.8°F | Resp 11 | Ht 63.0 in | Wt 184.0 lb

## 2018-08-14 DIAGNOSIS — K2980 Duodenitis without bleeding: Secondary | ICD-10-CM | POA: Diagnosis not present

## 2018-08-14 DIAGNOSIS — K640 First degree hemorrhoids: Secondary | ICD-10-CM

## 2018-08-14 DIAGNOSIS — K635 Polyp of colon: Secondary | ICD-10-CM

## 2018-08-14 DIAGNOSIS — Z86010 Personal history of colonic polyps: Secondary | ICD-10-CM | POA: Diagnosis not present

## 2018-08-14 DIAGNOSIS — D125 Benign neoplasm of sigmoid colon: Secondary | ICD-10-CM

## 2018-08-14 DIAGNOSIS — D120 Benign neoplasm of cecum: Secondary | ICD-10-CM | POA: Diagnosis not present

## 2018-08-14 DIAGNOSIS — R933 Abnormal findings on diagnostic imaging of other parts of digestive tract: Secondary | ICD-10-CM

## 2018-08-14 DIAGNOSIS — K269 Duodenal ulcer, unspecified as acute or chronic, without hemorrhage or perforation: Secondary | ICD-10-CM

## 2018-08-14 DIAGNOSIS — E7800 Pure hypercholesterolemia, unspecified: Secondary | ICD-10-CM | POA: Diagnosis not present

## 2018-08-14 DIAGNOSIS — K5730 Diverticulosis of large intestine without perforation or abscess without bleeding: Secondary | ICD-10-CM

## 2018-08-14 DIAGNOSIS — Z800 Family history of malignant neoplasm of digestive organs: Secondary | ICD-10-CM

## 2018-08-14 DIAGNOSIS — K3189 Other diseases of stomach and duodenum: Secondary | ICD-10-CM | POA: Diagnosis not present

## 2018-08-14 DIAGNOSIS — I10 Essential (primary) hypertension: Secondary | ICD-10-CM | POA: Diagnosis not present

## 2018-08-14 DIAGNOSIS — R1013 Epigastric pain: Secondary | ICD-10-CM

## 2018-08-14 MED ORDER — OMEPRAZOLE 20 MG PO CPDR: 20.0000 mg | DELAYED_RELEASE_CAPSULE | Freq: Two times a day (BID) | ORAL | 1 refills | Status: DC

## 2018-08-14 MED ORDER — SODIUM CHLORIDE 0.9 % IV SOLN: 500.0000 mL | Freq: Once | INTRAVENOUS | Status: DC

## 2018-08-14 NOTE — Progress Notes (Signed)
Called to room to assist during endoscopic procedure.  Patient ID and intended procedure confirmed with present staff. Received instructions for my participation in the procedure from the performing physician.  

## 2018-08-14 NOTE — Patient Instructions (Signed)
Use Prilosec 20 mg Twice daily for 8 weeks  STOP taking all NSAIDS  YOU HAD AN ENDOSCOPIC PROCEDURE TODAY AT [facility]:   Refer to the procedure report that was given to you for any specific questions about what was found during the examination.  If the procedure report does not answer your questions, please call your gastroenterologist to clarify.  If you requested that your care partner not be given the details of your procedure findings, then the procedure report has been included in a sealed envelope for you to review at your convenience later.  YOU SHOULD EXPECT: Some feelings of bloating in the abdomen. Passage of more gas than usual.  Walking can help get rid of the air that was put into your GI tract during the procedure and reduce the bloating. If you had a lower endoscopy (such as a colonoscopy or flexible sigmoidoscopy) you may notice spotting of blood in your stool or on the toilet paper. If you underwent a bowel prep for your procedure, you may not have a normal bowel movement for a few days.  Please Note:  You might notice some irritation and congestion in your nose or some drainage.  This is from the oxygen used during your procedure.  There is no need for concern and it should clear up in a day or so.  SYMPTOMS TO REPORT IMMEDIATELY:   Following lower endoscopy (colonoscopy or flexible sigmoidoscopy):  Excessive amounts of blood in the stool  Significant tenderness or worsening of abdominal pains  Swelling of the abdomen that is new, acute  Fever of 100F or higher   Following upper endoscopy (EGD)  Vomiting of blood or coffee ground material  New chest pain or pain under the shoulder blades  Painful or persistently difficult swallowing  New shortness of breath  Fever of 100F or higher  Black, tarry-looking stools  For urgent or emergent issues, a gastroenterologist can be reached at any hour by calling [phone].   DIET:  We do recommend a  small meal at first, but then you may proceed to your regular diet.  Drink plenty of fluids but you should avoid alcoholic beverages for 24 hours.  ACTIVITY:  You should plan to take it easy for the rest of today and you should NOT DRIVE or use heavy machinery until tomorrow (because of the sedation medicines used during the test).    FOLLOW UP: Our staff will call the number listed on your records the next business day following your procedure to check on you and address any questions or concerns that you may have regarding the information given to you following your procedure. If we do not reach you, we will leave a message.  However, if you are feeling well and you are not experiencing any problems, there is no need to return our call.  We will assume that you have returned to your regular daily activities without incident.  If any biopsies were taken you will be contacted by phone or by letter within the next 1-3 weeks.  Please call us at [phone] if you have not heard about the biopsies in 3 weeks.    SIGNATURES/CONFIDENTIALITY: You and/or your care partner have signed paperwork which will be entered into your electronic medical record.  These signatures attest to the fact that that the information above on your After Visit Summary has been reviewed and is understood.  Full responsibility of the confidentiality of this discharge information lies with you  and/or your care-partner.

## 2018-08-14 NOTE — Op Note (Signed)
[place] Patient Name: Rebecca Lopez Procedure Date: 08/14/2018 2:10 PM MRN: [id number] Endoscopist: Gerrit Heck , MD Age: 72 Referring MD:  Date of Birth: 02-05-1946 Gender: Female Account #: [id number] Procedure:                Colonoscopy Indications:              Screening in patient at increased risk: Colorectal                            cancer in mother 39 or older, Screening in patient                            at increased risk: Colorectal cancer in sister                            before age 60, High risk colon cancer surveillance:                            Personal history of sessile serrated colon polyp                            (less than 10 mm in size) with no dysplasia, Last                            colonoscopy 5 years ago and notable for a single 7                            mm Sessile Serrated polyp with recommendation to                            repeat in 5 years for ongoing surveillance. Medicines:                Monitored Anesthesia Care Procedure:                Pre-Anesthesia Assessment:                           - Prior to the procedure, a History and Physical                            was performed, and patient medications and                            allergies were reviewed. The patient's tolerance of                            previous anesthesia was also reviewed. The risks                            and benefits of the procedure and the sedation                            options and risks were discussed with the patient.  All questions were answered, and informed consent                            was obtained. Prior Anticoagulants: The patient has                            taken no previous anticoagulant or antiplatelet                            agents. ASA Grade Assessment: II - A patient with                            mild systemic disease. After reviewing the risks                            and  benefits, the patient was deemed in                            satisfactory condition to undergo the procedure.                           After obtaining informed consent, the colonoscope                            was passed under direct vision. Throughout the                            procedure, the patient's blood pressure, pulse, and                            oxygen saturations were monitored continuously. The                            Colonoscope was introduced through the anus and                            advanced to the the terminal ileum. The colonoscopy                            was performed without difficulty. The patient                            tolerated the procedure well. The quality of the                            bowel preparation was adequate. Scope In: 2:50:57 PM Scope Out: 3:08:23 PM Scope Withdrawal Time: 0 hours 14 minutes 12 seconds  Total Procedure Duration: 0 hours 17 minutes 26 seconds  Findings:                 The perianal and digital rectal examinations were                            normal.  Two sessile polyps were found in the cecum. The                            polyps were 3 to 4 mm in size. These polyps were                            removed with a cold snare. Resection and retrieval                            were complete. Estimated blood loss was minimal.                           A 5 mm polyp was found in the sigmoid colon. The                            polyp was sessile. The polyp was removed with a                            cold snare. Resection and retrieval were complete.                            Estimated blood loss was minimal.                           Multiple small and large-mouthed diverticula were                            found in the sigmoid colon, descending colon,                            transverse colon and ascending colon.                           Non-bleeding internal hemorrhoids were  found during                            retroflexion. The hemorrhoids were small.                           The terminal ileum appeared normal. Complications:            No immediate complications. Estimated Blood Loss:     Estimated blood loss was minimal. Impression:               - Two 3 to 4 mm polyps in the cecum, removed with a                            cold snare. Resected and retrieved.                           - One 5 mm polyp in the sigmoid colon, removed with  a cold snare. Resected and retrieved.                           - Diverticulosis in the sigmoid colon, in the                            descending colon, in the transverse colon and in                            the ascending colon.                           - Non-bleeding internal hemorrhoids.                           - The examined portion of the ileum was normal. Recommendation:           - Patient has a contact number available for                            emergencies. The signs and symptoms of potential                            delayed complications were discussed with the                            patient. Return to normal activities tomorrow.                            Written discharge instructions were provided to the                            patient.                           - Resume previous diet today.                           - Continue present medications.                           - Await pathology results.                           - Repeat colonoscopy in 3 - 5 years for                            surveillance based on pathology results.                           - Return to GI office PRN. Gerrit Heck, MD 08/14/2018 3:30:08 PM

## 2018-08-14 NOTE — Op Note (Signed)
[place] Patient Name: Rebecca Lopez Procedure Date: 08/14/2018 2:10 PM MRN: [id number] Endoscopist: Gerrit Heck , MD Age: 72 Referring MD:  Date of Birth: 1946-08-23 Gender: Female Account #: [id number] Procedure:                Upper GI endoscopy Indications:              Epigastric abdominal pain, Abnormal CT of the GI                            tract                           Recently presented to the ER in September 2019 with                            evaluation notable for CT demonstrating                            inflammatory changes in the proximal duodenum                            suspicious for ulcer. Medicines:                Monitored Anesthesia Care Procedure:                Pre-Anesthesia Assessment:                           - Prior to the procedure, a History and Physical                            was performed, and patient medications and                            allergies were reviewed. The patient's tolerance of                            previous anesthesia was also reviewed. The risks                            and benefits of the procedure and the sedation                            options and risks were discussed with the patient.                            All questions were answered, and informed consent                            was obtained. Prior Anticoagulants: The patient has                            taken no previous anticoagulant or antiplatelet  agents. ASA Grade Assessment: II - A patient with                            mild systemic disease. After reviewing the risks                            and benefits, the patient was deemed in                            satisfactory condition to undergo the procedure.                           After obtaining informed consent, the endoscope was                            passed under direct vision. Throughout the                            procedure, the  patient's blood pressure, pulse, and                            oxygen saturations were monitored continuously. The                            Endoscope was introduced through the mouth, and                            advanced to the third part of duodenum. The upper                            GI endoscopy was accomplished without difficulty.                            The patient tolerated the procedure well. Scope In: Scope Out: Findings:                 The examined esophagus was normal.                           Evidence of a previous surgical anastomosis was                            found in the gastric fundus and gastric cardia.                            This was characterized by healthy appearing mucosa.                           The gastric body, gastric antrum and pylorus were                            normal.                           Localized moderately erythematous mucosa  without                            active bleeding and with no stigmata of bleeding                            was found in the distal duodenal bulb and in the                            first portion of the duodenum. There were no ulcers                            but the lumen was mildly narrowed for 1 cm.                            Biopsies were taken with a cold forceps for                            histology. Estimated blood loss was minimal.                           The second portion of the duodenum and third                            portion of the duodenum were normal. Complications:            No immediate complications. Estimated Blood Loss:     Estimated blood loss was minimal. Impression:               - Normal esophagus.                           - A previous surgically altered anatomy was found                            in the proximal stomach, characterized by healthy                            appearing mucosa. Location and appearance                            suggestive of  fundoplication but medical record                            denotes history of lap band.                           - Normal gastric body, antrum and pylorus.                           - Erythematous duodenopathy. Biopsied. Appearance                            suggestive of NSAID duodenitis with possible early  duodenal diaphragm without presence of ulcer.                           - Normal second portion of the duodenum and third                            portion of the duodenum. Recommendation:           - Patient has a contact number available for                            emergencies. The signs and symptoms of potential                            delayed complications were discussed with the                            patient. Return to normal activities tomorrow.                            Written discharge instructions were provided to the                            patient.                           - Resume previous diet today.                           - Continue present medications.                           - Await pathology results.                           - Use Prilosec (omeprazole) 20 mg PO BID for 8                            weeks to promote mucosal healing.                           - Stop all NSAIDs.                           - Return to GI clinic in 2-3 months. Gerrit Heck, MD 08/14/2018 3:25:15 PM

## 2018-08-17 ENCOUNTER — Telehealth: Payer: Self-pay

## 2018-08-17 NOTE — Telephone Encounter (Signed)
  Follow up Call-  Call back number 08/14/2018  Post procedure Call Back phone  # [phone]  Permission to leave phone message Yes  Some recent data might be hidden     Patient questions:  Do you have a fever, pain , or abdominal swelling? No. Pain Score  0 *  Have you tolerated food without any problems? Yes.    Have you been able to return to your normal activities? Yes.    Do you have any questions about your discharge instructions: Diet   No. Medications  No. Follow up visit  No.  Do you have questions or concerns about your Care? No.  Actions: * If pain score is 4 or above: No action needed, pain <4.

## 2018-08-19 ENCOUNTER — Encounter: Payer: Medicare Other | Admitting: Gastroenterology

## 2018-08-21 ENCOUNTER — Encounter: Payer: Self-pay | Admitting: Gastroenterology

## 2018-08-24 ENCOUNTER — Encounter: Payer: Medicare Other | Admitting: Gastroenterology

## 2018-08-24 ENCOUNTER — Encounter

## 2018-09-10 ENCOUNTER — Other Ambulatory Visit: Payer: Self-pay

## 2018-09-10 ENCOUNTER — Telehealth: Payer: Self-pay | Admitting: Gastroenterology

## 2018-09-10 MED ORDER — OMEPRAZOLE 20 MG PO CPDR: 20.0000 mg | DELAYED_RELEASE_CAPSULE | Freq: Two times a day (BID) | ORAL | 1 refills | Status: DC

## 2018-09-10 NOTE — Progress Notes (Signed)
Rx sent to new pharmacy.

## 2018-09-23 ENCOUNTER — Other Ambulatory Visit: Payer: Self-pay | Admitting: Family Medicine

## 2018-09-23 DIAGNOSIS — I10 Essential (primary) hypertension: Secondary | ICD-10-CM

## 2018-09-23 NOTE — Telephone Encounter (Signed)
Copied from [place] [id number]. Topic: Quick Communication - Rx Refill/Question >> Sep 23, 2018  3:56 PM Virl Axe D wrote: Medication: losartan-hydrochlorothiazide (HYZAAR) 50-12.5 MG tablet / Pt has a new pharmacy. Pharmacy instructed patient to have PCP resend rx. Has the patient contacted their pharmacy? Yes.   (Agent: If no, request that the patient contact the pharmacy for the refill.) (Agent: If yes, when and what did the pharmacy advise?)  Preferred Pharmacy (with phone number or street name): [facility] [address], [place] - [place] [phone] (Phone) [phone] (Fax)  Agent: Please be advised that RX refills may take up to 3 business days. We ask that you follow-up with your pharmacy.

## 2018-09-28 NOTE — Telephone Encounter (Signed)
noted 

## 2018-09-30 MED ORDER — LOSARTAN POTASSIUM-HCTZ 50-12.5 MG PO TABS: 0.5000 | ORAL_TABLET | Freq: Every day | ORAL | 3 refills | Status: DC

## 2018-09-30 NOTE — Telephone Encounter (Signed)
Copied from [place] [phone]. Topic: Quick Communication - Rx Refill/Question >> Sep 30, 2018 10:33 AM Rebecca Lopez E wrote: Medication: losartan-hydrochlorothiazide (HYZAAR) 50-12.5 MG tablet - Pt is out of Medication has been waiting on transfer to new pharmacy for over  a week   Has the patient contacted their pharmacy? Yes - [facility] advise Pt to have office send new Rx   Preferred Pharmacy (with phone number or street name): [facility] [address], [place] - [place] [phone] (Phone) [phone] (Fax)    Agent: Please be advised that RX refills may take up to 3 business days. We ask that you follow-up with your pharmacy.

## 2019-04-01 ENCOUNTER — Other Ambulatory Visit: Payer: Self-pay

## 2019-05-29 NOTE — Progress Notes (Addendum)
[place] at [facility] [address] [place], [place] [address] [phone]  Date:  05/31/2019   Name:  Rebecca Lopez   DOB:  1946-01-08   MRN:  [id number]  PCP:  Darreld Mclean, MD    Chief Complaint: Hypertension, Flu Vaccine, and Polymyalgia   History of Present Illness:  Rebecca Lopez is a 73 y.o. very pleasant female patient who presents with the following:  Here today for follow-up visit Last seen by myself in November 2019 History of hypertension, osteoporosis, hyperlipidemia, vitamin D deficiency She has not been able to tolerate Fosamax for osteoporosis-at her last visit we planned to change to Prolia once her new prescription plan was in place.  We will get this in motion today  She also has not tolerated statins well and prefers not to take   Needs flu shot today- give today  Mammogram January 2019, DEXA also about 2 years ago Colon cancer screening up-to-date Can suggest Shingrix Most recent labs about 1 year ago- she is fasting today and took her meds   She has noted some aches in both shoulders and arms off and on- she has noted this for about 2 months now.  She thinks this might be due to stress and less activity  Notes that her cousin had polymyalgia rheumatica- she wonders if she might have something like this  She has noted headaches off and on- not a new thing She does do needlepoint and notes that this is a bit harder recently - may need to update her corrective lenses rx   She sometimes has a harder time walking a longer distance and would like a HD placard - esp when her sister (who is more disabled) is with her in the car she might need to use a HD space   Patient Active Problem List   Diagnosis Date Noted  . Osteoporosis 09/30/2017  . Hyperlipidemia 10/04/2014  . Essential hypertension 10/04/2014  . Vitamin D deficiency 10/04/2014  . Family history of malignant neoplasm of gastrointestinal tract  03/10/2013    Past Medical History:  Diagnosis Date  . Allergy   . Anemia    before hysterectomy   . Arthritis   . Cataract    beginnings   . Chronic kidney disease    kidney stone  . Depression   . Duodenal ulcer   . GERD (gastroesophageal reflux disease)   . Hyperlipidemia   . Hypertension   . Neuromuscular disorder (HCC)    neuropathy, CTS  . Osteoporosis     Past Surgical History:  Procedure Laterality Date  . ABDOMINAL HYSTERECTOMY    . APPENDECTOMY    . arm surgery    . BARIATRIC SURGERY    . COLONOSCOPY    . CTR Bilateral 2005   had right hand done 1999  . POLYPECTOMY    . TONSILLECTOMY      Social History   Tobacco Use  . Smoking status: Never Smoker  . Smokeless tobacco: Never Used  Substance Use Topics  . Alcohol use: Yes    Comment: gin and wine 2x/month  . Drug use: No    Family History  Problem Relation Age of Onset  . Hypertension Mother   . Colon cancer Mother        pt states had no chemo, radiation   . Colon polyps Mother   . Heart disease Father   . Cancer Sister   .  Colon cancer Sister 43       late 5's  . Stroke Maternal Grandfather     Allergies  Allergen Reactions  . Amlodipine     Possible small itchy spots on skin  . Fosamax [Alendronate Sodium] Other (See Comments)    Achy after receiving     Medication list has been reviewed and updated.  Current Outpatient Medications on File Prior to Visit  Medication Sig Dispense Refill  . cholecalciferol (VITAMIN D) 1000 units tablet Take 1,000 Units by mouth daily.     No current facility-administered medications on file prior to visit.     Review of Systems:  As per HPI- otherwise negative. No fever or chills No CP or SOB    Physical Examination: Vitals:   05/31/19 1029  BP: 128/82  Pulse: 87  Resp: 16  Temp: (!) 96.3 F (35.7 C)  SpO2: 97%   Vitals:   05/31/19 1029  Weight: 193 lb (87.5 kg)  Height: 5\' 3"  (1.6 m)   Body mass index is 34.19  kg/m. Ideal Body Weight: Weight in (lb) to have BMI = 25: 140.8  GEN: WDWN, NAD, Non-toxic, A & O x 3, obese, looks well HEENT: Atraumatic, Normocephalic. Neck supple. No masses, No LAD. PEERL, limited nondilated funduscopic exam is normal Ears and Nose: No external deformity. CV: RRR, No M/G/R. No JVD. No thrill. No extra heart sounds. PULM: CTA B, no wheezes, crackles, rhonchi. No retractions. No resp. distress. No accessory muscle use. ABD: S, NT, ND, +BS. No rebound. No HSM.  LAP-BAND is palpable in her abdomen EXTR: No c/c.  Soft edema in both feet only bilaterally.  Ankles normal NEURO Normal gait.  PSYCH: Normally interactive. Conversant. Not depressed or anxious appearing.  Calm demeanor.  Normal range of motion of both shoulders.  She notes she may have tenderness in her deltoid muscles bilaterally, this occurs on and off and may bother her with minimal exertion such as pulling the covers up in bed. This tenderness over is not reproducible on exam   Assessment and Plan: Immunization due - Plan: Flu Vaccine QUAD High Dose(Fluad)  Essential hypertension - Plan: CBC, Comprehensive metabolic panel, losartan-hydrochlorothiazide (HYZAAR) 50-12.5 MG tablet  Osteoporosis without current pathological fracture, unspecified osteoporosis type - Plan: Vitamin D (25 hydroxy)  Vitamin D deficiency - Plan: Vitamin D (25 hydroxy)  Screening for breast cancer  Mixed hyperlipidemia - Plan: Lipid panel  Body aches - Plan: Sedimentation rate, CK, ANA, C-reactive protein  Here today for periodic follow-up exam.  Blood pressure well controlled, continue current medication. History of vitamin D deficiency and osteoporosis.  She wishes to start on Prolia, will have my nurse work on her authorization now.  Will check vitamin D level Other labs pending as above Issued handicap placard Flu shot given, recommended Shingrix  Signed Lamar Blinks, MD  Received her labs so far-  Results for  orders placed or performed in visit on 05/31/19  CBC  Result Value Ref Range   WBC 6.8 4.0 - 10.5 K/uL   RBC 4.71 3.87 - 5.11 Mil/uL   Platelets 220.0 150.0 - 400.0 K/uL   Hemoglobin 15.1 (H) 12.0 - 15.0 g/dL   HCT 44.7 36.0 - 46.0 %   MCV 94.9 78.0 - 100.0 fl   MCHC 33.8 30.0 - 36.0 g/dL   RDW 13.7 11.5 - 15.5 %  Comprehensive metabolic panel  Result Value Ref Range   Sodium 141 135 - 145 mEq/L   Potassium 3.8  3.5 - 5.1 mEq/L   Chloride 105 96 - 112 mEq/L   CO2 26 19 - 32 mEq/L   Glucose, Bld 86 70 - 99 mg/dL   BUN 17 6 - 23 mg/dL   Creatinine, Ser 0.90 0.40 - 1.20 mg/dL   Total Bilirubin 0.4 0.2 - 1.2 mg/dL   Alkaline Phosphatase 97 39 - 117 U/L   AST 18 0 - 37 U/L   ALT 18 0 - 35 U/L   Total Protein 6.9 6.0 - 8.3 g/dL   Albumin 4.2 3.5 - 5.2 g/dL   Calcium 10.0 8.4 - 10.5 mg/dL   GFR 61.63 >60.00 mL/min  Lipid panel  Result Value Ref Range   Cholesterol 266 (H) 0 - 200 mg/dL   Triglycerides 149.0 0.0 - 149.0 mg/dL   HDL 59.30 >39.00 mg/dL   VLDL 29.8 0.0 - 40.0 mg/dL   LDL Cholesterol 177 (H) 0 - 99 mg/dL   Total CHOL/HDL Ratio 4    NonHDL 206.59   Vitamin D (25 hydroxy)  Result Value Ref Range   VITD 24.44 (L) 30.00 - 100.00 ng/mL  Sedimentation rate  Result Value Ref Range   Sed Rate 26 0 - 30 mm/hr  CK  Result Value Ref Range   Total CK 37 7 - 177 U/L  ANA  Result Value Ref Range   Anti Nuclear Antibody (ANA) POSITIVE (A) NEGATIVE  C-reactive protein  Result Value Ref Range   CRP <1.0 0.5 - 20.0 mg/dL  Anti-nuclear ab-titer (ANA titer)  Result Value Ref Range   ANA Titer 1 1:320 (H) titer   ANA Pattern 1 Nuclear, Homogeneous (A)    ANA TITER 1:320 (H) titer   ANA PATTERN Nuclear, Speckled (A)    Await last results to send letter- does not have mychart set up   Received her last labs 10/1- letter to pt Her ANA is significantly positive.  Referral the rheum

## 2019-05-29 NOTE — Patient Instructions (Addendum)
It was very nice to see you again today, I will be in touch with your labs as soon as possible Please consider having the Shingrix series given at your drugstore Continue current dosage of BP med Handicapped placard issued today We will get you set up for Prolia

## 2019-05-31 ENCOUNTER — Encounter: Payer: Self-pay | Admitting: Family Medicine

## 2019-05-31 ENCOUNTER — Other Ambulatory Visit: Payer: Self-pay

## 2019-05-31 ENCOUNTER — Ambulatory Visit (INDEPENDENT_AMBULATORY_CARE_PROVIDER_SITE_OTHER): Payer: Medicare Other | Admitting: Family Medicine

## 2019-05-31 VITALS — BP 128/82 | HR 87 | Temp 96.3°F | Resp 16 | Ht 63.0 in | Wt 193.0 lb

## 2019-05-31 DIAGNOSIS — Z1239 Encounter for other screening for malignant neoplasm of breast: Secondary | ICD-10-CM

## 2019-05-31 DIAGNOSIS — Z23 Encounter for immunization: Secondary | ICD-10-CM | POA: Diagnosis not present

## 2019-05-31 DIAGNOSIS — M810 Age-related osteoporosis without current pathological fracture: Secondary | ICD-10-CM | POA: Diagnosis not present

## 2019-05-31 DIAGNOSIS — E782 Mixed hyperlipidemia: Secondary | ICD-10-CM | POA: Diagnosis not present

## 2019-05-31 DIAGNOSIS — R52 Pain, unspecified: Secondary | ICD-10-CM | POA: Diagnosis not present

## 2019-05-31 DIAGNOSIS — I10 Essential (primary) hypertension: Secondary | ICD-10-CM | POA: Diagnosis not present

## 2019-05-31 DIAGNOSIS — E559 Vitamin D deficiency, unspecified: Secondary | ICD-10-CM | POA: Diagnosis not present

## 2019-05-31 LAB — COMPREHENSIVE METABOLIC PANEL
ALT: 18 U/L (ref 0–35)
AST: 18 U/L (ref 0–37)
Albumin: 4.2 g/dL (ref 3.5–5.2)
Alkaline Phosphatase: 97 U/L (ref 39–117)
BUN: 17 mg/dL (ref 6–23)
CO2: 26 mEq/L (ref 19–32)
Calcium: 10 mg/dL (ref 8.4–10.5)
Chloride: 105 mEq/L (ref 96–112)
Creatinine, Ser: 0.9 mg/dL (ref 0.40–1.20)
GFR: 61.63 mL/min (ref >60.00)
Glucose, Bld: 86 mg/dL (ref 70–99)
Potassium: 3.8 mEq/L (ref 3.5–5.1)
Sodium: 141 mEq/L (ref 135–145)
Total Bilirubin: 0.4 mg/dL (ref 0.2–1.2)
Total Protein: 6.9 g/dL (ref 6.0–8.3)

## 2019-05-31 LAB — LIPID PANEL
Cholesterol: 266 mg/dL — ABNORMAL HIGH (ref 0–200)
HDL: 59.3 mg/dL (ref >39.00)
LDL Cholesterol: 177 mg/dL — ABNORMAL HIGH (ref 0–99)
NonHDL: 206.59
Total CHOL/HDL Ratio: 4
Triglycerides: 149 mg/dL (ref 0.0–149.0)
VLDL: 29.8 mg/dL (ref 0.0–40.0)

## 2019-05-31 LAB — SEDIMENTATION RATE: Sed Rate: 26 mm/h (ref 0–30)

## 2019-05-31 LAB — C-REACTIVE PROTEIN: CRP: <1 mg/dL (ref 0.5–20.0)

## 2019-05-31 LAB — CBC
HCT: 44.7 % (ref 36.0–46.0)
Hemoglobin: 15.1 g/dL — ABNORMAL HIGH (ref 12.0–15.0)
MCHC: 33.8 g/dL (ref 30.0–36.0)
MCV: 94.9 fl (ref 78.0–100.0)
Platelets: 220 10*3/uL (ref 150.0–400.0)
RBC: 4.71 Mil/uL (ref 3.87–5.11)
RDW: 13.7 % (ref 11.5–15.5)
WBC: 6.8 10*3/uL (ref 4.0–10.5)

## 2019-05-31 LAB — VITAMIN D 25 HYDROXY (VIT D DEFICIENCY, FRACTURES): VITD: 24.44 ng/mL — ABNORMAL LOW (ref 30.00–100.00)

## 2019-05-31 LAB — CK: Total CK: 37 U/L (ref 7–177)

## 2019-05-31 MED ORDER — LOSARTAN POTASSIUM-HCTZ 50-12.5 MG PO TABS: 0.5000 | ORAL_TABLET | Freq: Every day | ORAL | 3 refills | Status: DC

## 2019-06-03 ENCOUNTER — Telehealth: Payer: Self-pay | Admitting: Family Medicine

## 2019-06-03 LAB — ANA: Anti Nuclear Antibody (ANA): POSITIVE — AB

## 2019-06-03 LAB — ANTI-NUCLEAR AB-TITER (ANA TITER)
ANA TITER: 1:320 {titer} — ABNORMAL HIGH
ANA Titer 1: 1:320 {titer} — ABNORMAL HIGH

## 2019-06-03 NOTE — Telephone Encounter (Signed)
Letter mailed to patient with lab results, No answer when trying to call her.

## 2019-06-03 NOTE — Telephone Encounter (Signed)
Patient requesting call back regarding labs from 9/28.

## 2019-06-03 NOTE — Addendum Note (Signed)
Addended by: Lamar Blinks C on: 06/03/2019 05:33 AM   Modules accepted: Orders

## 2019-06-04 NOTE — Telephone Encounter (Signed)
Called and discussed with pt- her rheum appt is later this month.  Apologized that her letter did not reach her prior to phone call from rheum- generally they are not quite so fast

## 2019-06-04 NOTE — Telephone Encounter (Signed)
Dr. Lorelei Pont, patient called and states rheumatology called her and told her her labs "were bad". I know you mailed a letter to her with results but she is wanting advise on what rheumatology stated.Could you explain what I should tell the patient to ease her mind or give her call?

## 2019-06-04 NOTE — Telephone Encounter (Signed)
I spoke with patient- she was asking why her ANA is so high and I had only the answer as to that is why she is seeing rheumatology to get a deeper look of this. She was not understanding why she was not explained this further. She stated "this was not the answer she was looking for but ok". I explained to her that once she sees rheumatology and we can figure this out in depth more, we would discuss with her further what this means and where to go from here. She stated "well unless I change doctors" I just stated ok and told her to have a good day. She states she will just wait to see rheumatology for further info. I offered her for you to call her and explain this better if she is still concerned. She stated "Well I assumed she would have called me yesterday so that's ok"

## 2019-06-04 NOTE — Telephone Encounter (Signed)
Yes- please give her a call back.  Tell her I apologize- rheum does not usually call so fast and I thought she would get my letter first!  Basically her ANA is high- this may mean something or nothing at all.  We are getting a rheum consultation to help [place] figure out if this means anything: Per my letter-  Your autoimmune labs were all normal (sed rate, CRP) except your ANA (a non specific lab which can be elevated in some autoimmune disorders as well as sometimes in healthy people) is significantly high.  This may mean something.  I am going to refer you to rheumatology for a consultation. Let me know if you don't hear about this appointment within a couple of weeks.   Thank you Rebecca Lopez

## 2019-06-07 DIAGNOSIS — E669 Obesity, unspecified: Secondary | ICD-10-CM | POA: Diagnosis not present

## 2019-06-07 DIAGNOSIS — M26609 Unspecified temporomandibular joint disorder, unspecified side: Secondary | ICD-10-CM | POA: Diagnosis not present

## 2019-06-07 DIAGNOSIS — M150 Primary generalized (osteo)arthritis: Secondary | ICD-10-CM | POA: Diagnosis not present

## 2019-06-07 DIAGNOSIS — R5383 Other fatigue: Secondary | ICD-10-CM | POA: Diagnosis not present

## 2019-06-07 DIAGNOSIS — M2550 Pain in unspecified joint: Secondary | ICD-10-CM | POA: Diagnosis not present

## 2019-06-07 DIAGNOSIS — R519 Headache, unspecified: Secondary | ICD-10-CM | POA: Diagnosis not present

## 2019-06-07 DIAGNOSIS — Z6834 Body mass index (BMI) 34.0-34.9, adult: Secondary | ICD-10-CM | POA: Diagnosis not present

## 2019-06-07 DIAGNOSIS — R768 Other specified abnormal immunological findings in serum: Secondary | ICD-10-CM | POA: Diagnosis not present

## 2019-07-05 DIAGNOSIS — M2550 Pain in unspecified joint: Secondary | ICD-10-CM | POA: Diagnosis not present

## 2019-07-05 DIAGNOSIS — R5383 Other fatigue: Secondary | ICD-10-CM | POA: Diagnosis not present

## 2019-07-05 DIAGNOSIS — M26609 Unspecified temporomandibular joint disorder, unspecified side: Secondary | ICD-10-CM | POA: Diagnosis not present

## 2019-07-05 DIAGNOSIS — M150 Primary generalized (osteo)arthritis: Secondary | ICD-10-CM | POA: Diagnosis not present

## 2019-07-05 DIAGNOSIS — R768 Other specified abnormal immunological findings in serum: Secondary | ICD-10-CM | POA: Diagnosis not present

## 2019-07-07 ENCOUNTER — Other Ambulatory Visit: Payer: Self-pay

## 2019-07-07 ENCOUNTER — Ambulatory Visit (INDEPENDENT_AMBULATORY_CARE_PROVIDER_SITE_OTHER): Payer: Medicare Other | Admitting: *Deleted

## 2019-07-07 DIAGNOSIS — M810 Age-related osteoporosis without current pathological fracture: Secondary | ICD-10-CM | POA: Diagnosis not present

## 2019-07-07 MED ORDER — DENOSUMAB 60 MG/ML ~~LOC~~ SOSY
60.0000 mg | PREFILLED_SYRINGE | Freq: Once | SUBCUTANEOUS | Status: AC
  Administered 2019-07-07: 60 mg via SUBCUTANEOUS

## 2019-07-07 NOTE — Progress Notes (Signed)
Patient here for prolia injection.  Injection given in left arm and patient tolerated well.

## 2019-09-27 NOTE — Progress Notes (Signed)
Virtual Visit via Audio Note  I connected with patient on 09/28/19 at  1:00 PM EST by audio enabled telemedicine application and verified that I am speaking with the correct person using two identifiers.   THIS ENCOUNTER IS A VIRTUAL VISIT DUE TO COVID-19 - PATIENT WAS NOT SEEN IN THE OFFICE. PATIENT HAS CONSENTED TO VIRTUAL VISIT / TELEMEDICINE VISIT   Location of patient: home  Location of provider: office  I discussed the limitations of evaluation and management by telemedicine and the availability of in person appointments. The patient expressed understanding and agreed to proceed.   Subjective:   Rebecca Lopez is a 74 y.o. female who presents for Medicare Annual (Subsequent) preventive examination.  Review of Systems:  Home Safety/Smoke Alarms: Feels safe in home. Smoke alarms in place.  Lives w/ sister in 1 story home.   Female:       Mammo- 10/08/17. Declines currently due to pandemic.       Dexa scan-  09/30/17      CCS- 08/14/18    Objective:     Vitals: Unable to assess. This visit is enabled though telemedicine due to Covid 19.  Advanced Directives 09/28/2019 05/24/2018 10/31/2016  Does Patient Have a Medical Advance Directive? No No No  Would patient like information on creating a medical advance directive? No - Patient declined No - Patient declined Yes (MAU/Ambulatory/Procedural Areas - Information given)    Tobacco Social History   Tobacco Use  Smoking Status Never Smoker  Smokeless Tobacco Never Used     Counseling given: Not Answered   Clinical Intake: Pain : No/denies pain     Past Medical History:  Diagnosis Date  . Allergy   . Anemia    before hysterectomy   . Arthritis   . Cataract    beginnings   . Chronic kidney disease    kidney stone  . Depression   . Duodenal ulcer   . GERD (gastroesophageal reflux disease)   . Hyperlipidemia   . Hypertension   . Neuromuscular disorder (HCC)    neuropathy, CTS  . Osteoporosis    Past Surgical  History:  Procedure Laterality Date  . ABDOMINAL HYSTERECTOMY    . APPENDECTOMY    . arm surgery    . BARIATRIC SURGERY    . COLONOSCOPY    . CTR Bilateral 2005   had right hand done 1999  . POLYPECTOMY    . TONSILLECTOMY     Family History  Problem Relation Age of Onset  . Hypertension Mother   . Colon cancer Mother        pt states had no chemo, radiation   . Colon polyps Mother   . Heart disease Father   . Cancer Sister   . Colon cancer Sister 56       late 26's  . Stroke Maternal Grandfather    Social History   Socioeconomic History  . Marital status: Married    Spouse name: Not on file  . Number of children: Not on file  . Years of education: Not on file  . Highest education level: Not on file  Occupational History  . Not on file  Tobacco Use  . Smoking status: Never Smoker  . Smokeless tobacco: Never Used  Substance and Sexual Activity  . Alcohol use: Yes    Comment: gin and wine 2x/month  . Drug use: No  . Sexual activity: Yes  Other Topics Concern  . Not on file  Social History  Narrative  . Not on file   Social Determinants of Health   Financial Resource Strain: Low Risk   . Difficulty of Paying Living Expenses: Not hard at all  Food Insecurity:   . Worried About Charity fundraiser in the Last Year: Not on file  . Ran Out of Food in the Last Year: Not on file  Transportation Needs:   . Lack of Transportation (Medical): Not on file  . Lack of Transportation (Non-Medical): Not on file  Physical Activity:   . Days of Exercise per Week: Not on file  . Minutes of Exercise per Session: Not on file  Stress:   . Feeling of Stress : Not on file  Social Connections:   . Frequency of Communication with Friends and Family: Not on file  . Frequency of Social Gatherings with Friends and Family: Not on file  . Attends Religious Services: Not on file  . Active Member of Clubs or Organizations: Not on file  . Attends Archivist Meetings: Not on  file  . Marital Status: Not on file    Outpatient Encounter Medications as of 09/28/2019  Medication Sig  . cholecalciferol (VITAMIN D) 1000 units tablet Take 1,000 Units by mouth daily.  Marland Kitchen denosumab (PROLIA) 60 MG/ML SOSY injection Inject 60 mg into the skin every 6 (six) months.  Marland Kitchen losartan-hydrochlorothiazide (HYZAAR) 50-12.5 MG tablet Take 0.5 tablets by mouth daily.   No facility-administered encounter medications on file as of 09/28/2019.    Activities of Daily Living In your present state of health, do you have any difficulty performing the following activities: 09/28/2019  Hearing? N  Vision? N  Difficulty concentrating or making decisions? N  Walking or climbing stairs? N  Dressing or bathing? N  Doing errands, shopping? N  Preparing Food and eating ? N  Using the Toilet? N  In the past six months, have you accidently leaked urine? N  Do you have problems with loss of bowel control? N  Managing your Medications? N  Managing your Finances? N  Housekeeping or managing your Housekeeping? N  Some recent data might be hidden    Patient Care Team: Copland, Gay Filler, MD as PCP - General (Family Medicine)    Assessment:   This is a routine wellness examination for [facility]. Physical assessment deferred to PCP.  Exercise Activities and Dietary recommendations Current Exercise Habits: The patient does not participate in regular exercise at present, Exercise limited by: None identified   Diet (meal preparation, eat out, water intake, caffeinated beverages, dairy products, fruits and vegetables): 24 hr recall Breakfast: skips Lunch: 1/2 [place] mike sandwich roast beef w/ lettuce tomoto and chips Dinner:   Caramel corn   Goals    . Increase physical activity       Fall Risk Fall Risk  09/28/2019 04/01/2019 10/31/2016  Falls in the past year? 0 1 Yes  Comment - Emmi Telephone Survey: data to providers prior to load -  Number falls in past yr: 0 1 2 or more  Comment - Emmi  Telephone Survey Actual Response = 2 -  Injury with Fall? 0 0 No  Risk Factor Category  - - High Fall Risk  Risk for fall due to : - - History of fall(s)  Follow up Education provided;Falls prevention discussed - -   Depression Screen PHQ 2/9 Scores 09/28/2019 10/31/2016 03/24/2015  PHQ - 2 Score 0 0 0     Cognitive Function Ad8 score reviewed for issues:  Issues making decisions:no  Less interest in hobbies / activities:no  Repeats questions, stories (family complaining):no  Trouble using ordinary gadgets (microwave, computer, phone):no  Forgets the month or year: no  Mismanaging finances: no  Remembering appts:no  Daily problems with thinking and/or memory:no Ad8 score is=0         Immunization History  Administered Date(s) Administered  . Fluad Quad(high Dose 65+) 05/31/2019  . Influenza, High Dose Seasonal PF 09/25/2017, 07/08/2018  . Influenza,inj,Quad PF,6+ Mos 08/10/2013, 08/16/2014  . Pneumococcal Conjugate-13 10/31/2016  . Pneumococcal Polysaccharide-23 07/08/2018  . Tdap 03/24/2015    Screening Tests Health Maintenance  Topic Date Due  . MAMMOGRAM  10/01/2019  . COLONOSCOPY  08/15/2023  . TETANUS/TDAP  03/23/2025  . INFLUENZA VACCINE  Completed  . DEXA SCAN  Completed  . Hepatitis C Screening  Completed  . PNA vac Low Risk Adult  Completed      Plan:    Please schedule your next medicare wellness visit with me in 1 yr.  Continue to eat heart healthy diet (full of fruits, vegetables, whole grains, lean protein, water--limit salt, fat, and sugar intake) and increase physical activity as tolerated.  Continue doing brain stimulating activities (puzzles, reading, adult coloring books, staying active) to keep memory sharp.   Bring a copy of your living will and/or healthcare power of attorney to your next office visit.   I have personally reviewed and noted the following in the patient's chart:   . Medical and social history . Use of alcohol,  tobacco or illicit drugs  . Current medications and supplements . Functional ability and status . Nutritional status . Physical activity . Advanced directives . List of other physicians . Hospitalizations, surgeries, and ER visits in previous 12 months . Vitals . Screenings to include cognitive, depression, and falls . Referrals and appointments  In addition, I have reviewed and discussed with patient certain preventive protocols, quality metrics, and best practice recommendations. A written personalized care plan for preventive services as well as general preventive health recommendations were provided to patient.     Shela Nevin, [place]  09/28/2019

## 2019-09-28 ENCOUNTER — Ambulatory Visit (INDEPENDENT_AMBULATORY_CARE_PROVIDER_SITE_OTHER): Payer: Medicare Other | Admitting: *Deleted

## 2019-09-28 ENCOUNTER — Other Ambulatory Visit: Payer: Self-pay

## 2019-09-28 ENCOUNTER — Encounter: Payer: Self-pay | Admitting: *Deleted

## 2019-09-28 DIAGNOSIS — Z0000 Encounter for general adult medical examination without abnormal findings: Secondary | ICD-10-CM

## 2019-09-28 NOTE — Patient Instructions (Signed)
Please schedule your next medicare wellness visit with me in 1 yr.  Continue to eat heart healthy diet (full of fruits, vegetables, whole grains, lean protein, water--limit salt, fat, and sugar intake) and increase physical activity as tolerated.  Continue doing brain stimulating activities (puzzles, reading, adult coloring books, staying active) to keep memory sharp.   Bring a copy of your living will and/or healthcare power of attorney to your next office visit.   Rebecca Lopez , Thank you for taking time to come for your Medicare Wellness Visit. I appreciate your ongoing commitment to your health goals. Please review the following plan we discussed and let me know if I can assist you in the future.   These are the goals we discussed: Goals    . Increase physical activity       This is a list of the screening recommended for you and due dates:  Health Maintenance  Topic Date Due  . Mammogram  10/01/2019  . Colon Cancer Screening  08/15/2023  . Tetanus Vaccine  03/23/2025  . Flu Shot  Completed  . DEXA scan (bone density measurement)  Completed  .  Hepatitis C: One time screening is recommended by Center for Disease Control  (CDC) for  adults born from [birth year] through 1963.   Completed  . Pneumonia vaccines  Completed    Preventive Care 72 Years and Older, Female Preventive care refers to lifestyle choices and visits with your health care provider that can promote health and wellness. This includes:  A yearly physical exam. This is also called an annual well check.  Regular dental and eye exams.  Immunizations.  Screening for certain conditions.  Healthy lifestyle choices, such as diet and exercise. What can I expect for my preventive care visit? Physical exam Your health care provider will check:  Height and weight. These may be used to calculate body mass index (BMI), which is a measurement that tells if you are at a healthy weight.  Heart rate and blood pressure.  Your  skin for abnormal spots. Counseling Your health care provider may ask you questions about:  Alcohol, tobacco, and drug use.  Emotional well-being.  Home and relationship well-being.  Sexual activity.  Eating habits.  History of falls.  Memory and ability to understand (cognition).  Work and work Statistician.  Pregnancy and menstrual history. What immunizations do I need?  Influenza (flu) vaccine  This is recommended every year. Tetanus, diphtheria, and pertussis (Tdap) vaccine  You may need a Td booster every 10 years. Varicella (chickenpox) vaccine  You may need this vaccine if you have not already been vaccinated. Zoster (shingles) vaccine  You may need this after age 72. Pneumococcal conjugate (PCV13) vaccine  One dose is recommended after age 72. Pneumococcal polysaccharide (PPSV23) vaccine  One dose is recommended after age 72. Measles, mumps, and rubella (MMR) vaccine  You may need at least one dose of MMR if you were born in 1957 or later. You may also need a second dose. Meningococcal conjugate (MenACWY) vaccine  You may need this if you have certain conditions. Hepatitis A vaccine  You may need this if you have certain conditions or if you travel or work in places where you may be exposed to hepatitis A. Hepatitis B vaccine  You may need this if you have certain conditions or if you travel or work in places where you may be exposed to hepatitis B. Haemophilus influenzae type b (Hib) vaccine  You may need this if  you have certain conditions. You may receive vaccines as individual doses or as more than one vaccine together in one shot (combination vaccines). Talk with your health care provider about the risks and benefits of combination vaccines. What tests do I need? Blood tests  Lipid and cholesterol levels. These may be checked every 5 years, or more frequently depending on your overall health.  Hepatitis C test.  Hepatitis B  test. Screening  Lung cancer screening. You may have this screening every year starting at age 72 if you have a 30-pack-year history of smoking and currently smoke or have quit within the past 15 years.  Colorectal cancer screening. All adults should have this screening starting at age 72 and continuing until age 43. Your health care provider may recommend screening at age 72 if you are at increased risk. You will have tests every 1-10 years, depending on your results and the type of screening test.  Diabetes screening. This is done by checking your blood sugar (glucose) after you have not eaten for a while (fasting). You may have this done every 1-3 years.  Mammogram. This may be done every 1-2 years. Talk with your health care provider about how often you should have regular mammograms.  BRCA-related cancer screening. This may be done if you have a family history of breast, ovarian, tubal, or peritoneal cancers. Other tests  Sexually transmitted disease (STD) testing.  Bone density scan. This is done to screen for osteoporosis. You may have this done starting at age 72. Follow these instructions at home: Eating and drinking  Eat a diet that includes fresh fruits and vegetables, whole grains, lean protein, and low-fat dairy products. Limit your intake of foods with high amounts of sugar, saturated fats, and salt.  Take vitamin and mineral supplements as recommended by your health care provider.  Do not drink alcohol if your health care provider tells you not to drink.  If you drink alcohol: ? Limit how much you have to 0-1 drink a day. ? Be aware of how much alcohol is in your drink. In the [place] one drink equals one 12 oz bottle of beer (355 mL), one 5 oz glass of wine (148 mL), or one 1 oz glass of hard liquor (44 mL). Lifestyle  Take daily care of your teeth and gums.  Stay active. Exercise for at least 30 minutes on 5 or more days each week.  Do not use any products that  contain nicotine or tobacco, such as cigarettes, e-cigarettes, and chewing tobacco. If you need help quitting, ask your health care provider.  If you are sexually active, practice safe sex. Use a condom or other form of protection in order to prevent STIs (sexually transmitted infections).  Talk with your health care provider about taking a low-dose aspirin or statin. What's next?  Go to your health care provider once a year for a well check visit.  Ask your health care provider how often you should have your eyes and teeth checked.  Stay up to date on all vaccines. This information is not intended to replace advice given to you by your health care provider. Make sure you discuss any questions you have with your health care provider. Document Revised: 08/13/2018 Document Reviewed: 08/13/2018 Elsevier Patient Education  2020 [employer].

## 2019-10-14 DIAGNOSIS — Z23 Encounter for immunization: Secondary | ICD-10-CM | POA: Diagnosis not present

## 2019-11-12 DIAGNOSIS — Z23 Encounter for immunization: Secondary | ICD-10-CM | POA: Diagnosis not present

## 2019-12-06 ENCOUNTER — Telehealth: Payer: Self-pay | Admitting: Family Medicine

## 2019-12-06 NOTE — Telephone Encounter (Signed)
Patient is requesting a call back, patient is wondering when she can get her porlia shot.   Please advise

## 2019-12-08 NOTE — Telephone Encounter (Signed)
Scheduled for 01/05/2020 spoke with patient.

## 2019-12-08 NOTE — Telephone Encounter (Signed)
Patient last injection 07/07/2019- next due on 01/05/2020

## 2020-01-05 ENCOUNTER — Other Ambulatory Visit: Payer: Self-pay

## 2020-01-05 ENCOUNTER — Ambulatory Visit (INDEPENDENT_AMBULATORY_CARE_PROVIDER_SITE_OTHER): Payer: Medicare Other

## 2020-01-05 ENCOUNTER — Telehealth: Payer: Self-pay | Admitting: Family Medicine

## 2020-01-05 DIAGNOSIS — M810 Age-related osteoporosis without current pathological fracture: Secondary | ICD-10-CM

## 2020-01-05 MED ORDER — DENOSUMAB 60 MG/ML ~~LOC~~ SOSY
60.0000 mg | PREFILLED_SYRINGE | Freq: Once | SUBCUTANEOUS | Status: AC
  Administered 2020-01-05: 60 mg via SUBCUTANEOUS

## 2020-01-05 NOTE — Telephone Encounter (Signed)
Ok to schedule new patient for Copland

## 2020-01-05 NOTE — Telephone Encounter (Signed)
Patient is requesting if her ,daughter Anderson Malta could establish with Dr. Lorelei Pont. Please advise .

## 2020-01-05 NOTE — Progress Notes (Addendum)
Patient here for prolia injection.  Injection given in left arm and patient tolerated well   Reviewed and agree Denny Peon MD

## 2020-05-10 NOTE — Patient Instructions (Addendum)
It was good to see you again today, I will be in touch with your labs soon as possible Try the atrovent nasal spray as needed for postnasal drip  We will try fluoxetine 20 mg daily for depression, let me know how this works for you  I ordered your mammogram, please complete this as soon as you are able

## 2020-05-10 NOTE — Progress Notes (Addendum)
[place] at [facility] [address] [place], [place] [address] [phone]  Date:  05/11/2020   Name:  Rebecca Lopez   DOB:  Dec 12, 1945   MRN:  [id number]  PCP:  Darreld Mclean, MD    Chief Complaint: Concern for Diabetes (urinary frequency, loss of vision/blurriness)   History of Present Illness:  Rebecca Lopez is a 74 y.o. very pleasant female patient who presents with the following:  Patient seen today with concern of diabetes/blood sugar-she has not been diagnosed with diabetes or prediabetes in the past, but is because of the concern that this could be the case due to symptoms she has noticed Last seen by myself about 1 year ago She has a long-term history of urinary frequency- this has gotten worse at night recently however Her eyesight is also getting gradually worse the last 4 months or so  She does have allergies- PND is her main symptom, it tends to cause a dry cough and is quite annoying. She would be interested in trying Atrovent nasal spray, she does not have any history of glaucoma  History of hypertension, hyperlipidemia, vitamin D deficiency, osteoporosis Her osteoporosis is being treated with Prolia-most recent infusion May of this year She has been to see rheumatology, Dr. Telford Nab does have some lab abnormalities specifically positive ANA, but is thought to have osteoarthritis  Mammogram-this is due, ordered for today Flu vaccine- pt will do, we do not have the senior dose COVID-19 vaccine- complete  DEXA scan January 2019 Colon cancer screening up-to-date Labs about 74 year old  The patient lives with her sister who has some health problems. This can be a challenging situation. Patient admits that she does suffer from some depression, anxiety is not so much an issue nor is suicidal ideation. She would be interested in trying some medication for depression in case it may help  Patient Active Problem List    Diagnosis Date Noted  . Osteoporosis 09/30/2017  . Hyperlipidemia 10/04/2014  . Essential hypertension 10/04/2014  . Vitamin D deficiency 10/04/2014  . Family history of malignant neoplasm of gastrointestinal tract 03/10/2013    Past Medical History:  Diagnosis Date  . Allergy   . Anemia    before hysterectomy   . Arthritis   . Cataract    beginnings   . Chronic kidney disease    kidney stone  . Depression   . Duodenal ulcer   . GERD (gastroesophageal reflux disease)   . Hyperlipidemia   . Hypertension   . Neuromuscular disorder (HCC)    neuropathy, CTS  . Osteoporosis     Past Surgical History:  Procedure Laterality Date  . ABDOMINAL HYSTERECTOMY    . APPENDECTOMY    . arm surgery    . BARIATRIC SURGERY    . COLONOSCOPY    . CTR Bilateral 2005   had right hand done 1999  . POLYPECTOMY    . TONSILLECTOMY      Social History   Tobacco Use  . Smoking status: Never Smoker  . Smokeless tobacco: Never Used  Substance Use Topics  . Alcohol use: Yes    Comment: gin and wine 2x/month  . Drug use: No    Family History  Problem Relation Age of Onset  . Hypertension Mother   . Colon cancer Mother        pt states had no chemo, radiation   . Colon polyps Mother   .  Heart disease Father   . Cancer Sister   . Colon cancer Sister 66       late 8's  . Stroke Maternal Grandfather     Allergies  Allergen Reactions  . Amlodipine     Possible small itchy spots on skin  . Fosamax [Alendronate Sodium] Other (See Comments)    Achy after receiving     Medication list has been reviewed and updated.  Current Outpatient Medications on File Prior to Visit  Medication Sig Dispense Refill  . cholecalciferol (VITAMIN D) 1000 units tablet Take 1,000 Units by mouth daily.    Marland Kitchen denosumab (PROLIA) 60 MG/ML SOSY injection Inject 60 mg into the skin every 6 (six) months.    Marland Kitchen losartan-hydrochlorothiazide (HYZAAR) 50-12.5 MG tablet Take 0.5 tablets by mouth daily. 45  tablet 3  . Multiple Vitamin (MULTIVITAMIN) tablet Take 1 tablet by mouth daily.     No current facility-administered medications on file prior to visit.    Review of Systems:  As per HPI- otherwise negative.   Physical Examination: Vitals:   05/11/20 1418  BP: 118/88  Pulse: 87  Resp: 16  SpO2: 98%   Vitals:   05/11/20 1418  Weight: 185 lb (83.9 kg)  Height: 5\' 3"  (1.6 m)   Body mass index is 32.77 kg/m. Ideal Body Weight: Weight in (lb) to have BMI = 25: 140.8  GEN: no acute distress. Obese, looks well HEENT: Atraumatic, Normocephalic.  Ears and Nose: No external deformity. CV: RRR, No M/G/R. No JVD. No thrill. No extra heart sounds. PULM: CTA B, no wheezes, crackles, rhonchi. No retractions. No resp. distress. No accessory muscle use. EXTR: No c/c/e PSYCH: Normally interactive. Conversant.    Assessment and Plan: Vitamin D deficiency - Plan: VITAMIN D 25 Hydroxy (Vit-D Deficiency, Fractures)  Mixed hyperlipidemia - Plan: Lipid panel  Essential hypertension - Plan: CBC, Comprehensive metabolic panel  Osteoporosis without current pathological fracture, unspecified osteoporosis type - Plan: VITAMIN D 25 Hydroxy (Vit-D Deficiency, Fractures)  Urinary frequency - Plan: Hemoglobin A1c, POCT urinalysis dipstick, Urine Culture  Screening for thyroid disorder - Plan: TSH  Adjustment disorder with depressed mood - Plan: FLUoxetine (PROZAC) 20 MG capsule  PND (post-nasal drip) - Plan: ipratropium (ATROVENT) 0.03 % nasal spray  Abnormal glucose - Plan: Hemoglobin A1c  Encounter for screening mammogram for malignant neoplasm of breast - Plan: MM 3D SCREEN BREAST BILATERAL  Patient today for follow-up visit Blood pressures under good control Vitamin D and other labs pending as above We will have her try Atrovent nasal as needed for postnasal drip She would like to try medication for depression, prescribed fluoxetine 20 mg. Explained most common side effects and what  to expect this medication. She will let me know how it works for her Ordered mammogram She plans had her flu shot in the community Will plan further follow- up pending labs.  This visit occurred during the SARS-CoV-2 public health emergency.  Safety protocols were in place, including screening questions prior to the visit, additional usage of staff PPE, and extensive cleaning of exam room while observing appropriate contact time as indicated for disinfecting solutions.    Signed Lamar Blinks, MD  Addendum 9/10, received her labs as below.  Message to patient  Results for orders placed or performed in visit on 05/11/20  CBC  Result Value Ref Range   WBC 7.1 3.8 - 10.8 Thousand/uL   RBC 4.89 3.80 - 5.10 Million/uL   Hemoglobin 15.6 (H) 11.7 - 15.5  g/dL   HCT 45.5 (H) 35 - 45 %   MCV 93.0 80.0 - 100.0 fL   MCH 31.9 27.0 - 33.0 pg   MCHC 34.3 32.0 - 36.0 g/dL   RDW 13.1 11.0 - 15.0 %   Platelets 231 140 - 400 Thousand/uL   MPV 10.3 7.5 - 12.5 fL  Comprehensive metabolic panel  Result Value Ref Range   Glucose, Bld 83 65 - 99 mg/dL   BUN 16 7 - 25 mg/dL   Creat 0.96 (H) 0.60 - 0.93 mg/dL   BUN/Creatinine Ratio 17 6 - 22 (calc)   Sodium 140 135 - 146 mmol/L   Potassium 4.0 3.5 - 5.3 mmol/L   Chloride 106 98 - 110 mmol/L   CO2 24 20 - 32 mmol/L   Calcium 10.0 8.6 - 10.4 mg/dL   Total Protein 7.0 6.1 - 8.1 g/dL   Albumin 4.1 3.6 - 5.1 g/dL   Globulin 2.9 1.9 - 3.7 g/dL (calc)   AG Ratio 1.4 1.0 - 2.5 (calc)   Total Bilirubin 0.5 0.2 - 1.2 mg/dL   Alkaline phosphatase (APISO) 64 37 - 153 U/L   AST 24 10 - 35 U/L   ALT 22 6 - 29 U/L  Hemoglobin A1c  Result Value Ref Range   Hgb A1c MFr Bld 5.3 <5.7 % of total Hgb   Mean Plasma Glucose 105 (calc)   eAG (mmol/L) 5.8 (calc)  Lipid panel  Result Value Ref Range   Cholesterol 287 (H) <200 mg/dL   HDL 55 > OR = 50 mg/dL   Triglycerides 193 (H) <150 mg/dL   LDL Cholesterol (Calc) 195 (H) mg/dL (calc)   Total CHOL/HDL Ratio  5.2 (H) <5.0 (calc)   Non-HDL Cholesterol (Calc) 232 (H) <130 mg/dL (calc)  TSH  Result Value Ref Range   TSH 2.82 0.40 - 4.50 mIU/L  VITAMIN D 25 Hydroxy (Vit-D Deficiency, Fractures)  Result Value Ref Range   Vit D, 25-Hydroxy 39 30 - 100 ng/mL  POCT urinalysis dipstick  Result Value Ref Range   Color, UA yellow yellow   Clarity, UA clear clear   Glucose, UA negative negative mg/dL   Bilirubin, UA negative negative   Ketones, POC UA trace (5) (A) negative mg/dL   Spec Grav, UA 1.020 1.010 - 1.025   Blood, UA negative negative   pH, UA 5.5 5.0 - 8.0   Protein Ur, POC negative negative mg/dL   Urobilinogen, UA 0.2 0.2 or 1.0 E.U./dL   Nitrite, UA Negative Negative   Leukocytes, UA Negative Negative   Your blood counts look fine except for mildly elevated blood counts.  This can make your blood somewhat thick, and could increase your risk of a blood clot.  If you like, donating blood at the [facility] is a great way to deal with this issue!   Metabolic profile looks fine Your A1c is normal, no sign of diabetes Thyroid, vitamin D normal I am still waiting on your urine culture  My only other concern at this time is your cholesterol-it is too high, and may increase your risk of heart attack or stroke.  If you are willing, I would suggest starting you on a cholesterol medication-let me know if okay with you, and I can certainly call in a prescription

## 2020-05-11 ENCOUNTER — Other Ambulatory Visit: Payer: Self-pay

## 2020-05-11 ENCOUNTER — Encounter: Payer: Self-pay | Admitting: Family Medicine

## 2020-05-11 ENCOUNTER — Ambulatory Visit (INDEPENDENT_AMBULATORY_CARE_PROVIDER_SITE_OTHER): Payer: Medicare Other | Admitting: Family Medicine

## 2020-05-11 VITALS — BP 118/88 | HR 87 | Resp 16 | Ht 63.0 in | Wt 185.0 lb

## 2020-05-11 DIAGNOSIS — R7309 Other abnormal glucose: Secondary | ICD-10-CM | POA: Diagnosis not present

## 2020-05-11 DIAGNOSIS — R350 Frequency of micturition: Secondary | ICD-10-CM

## 2020-05-11 DIAGNOSIS — I10 Essential (primary) hypertension: Secondary | ICD-10-CM | POA: Diagnosis not present

## 2020-05-11 DIAGNOSIS — E559 Vitamin D deficiency, unspecified: Secondary | ICD-10-CM | POA: Diagnosis not present

## 2020-05-11 DIAGNOSIS — Z1329 Encounter for screening for other suspected endocrine disorder: Secondary | ICD-10-CM | POA: Diagnosis not present

## 2020-05-11 DIAGNOSIS — R0982 Postnasal drip: Secondary | ICD-10-CM

## 2020-05-11 DIAGNOSIS — E782 Mixed hyperlipidemia: Secondary | ICD-10-CM

## 2020-05-11 DIAGNOSIS — M810 Age-related osteoporosis without current pathological fracture: Secondary | ICD-10-CM

## 2020-05-11 DIAGNOSIS — F4321 Adjustment disorder with depressed mood: Secondary | ICD-10-CM

## 2020-05-11 DIAGNOSIS — Z1231 Encounter for screening mammogram for malignant neoplasm of breast: Secondary | ICD-10-CM | POA: Diagnosis not present

## 2020-05-11 LAB — POCT URINALYSIS DIP (MANUAL ENTRY)
Bilirubin, UA: NEGATIVE
Blood, UA: NEGATIVE
Glucose, UA: NEGATIVE mg/dL
Leukocytes, UA: NEGATIVE
Nitrite, UA: NEGATIVE
Protein Ur, POC: NEGATIVE mg/dL
Spec Grav, UA: 1.02 (ref 1.010–1.025)
Urobilinogen, UA: 0.2 E.U./dL
pH, UA: 5.5 (ref 5.0–8.0)

## 2020-05-11 MED ORDER — FLUOXETINE HCL 20 MG PO CAPS: 20.0000 mg | ORAL_CAPSULE | Freq: Every day | ORAL | 3 refills | Status: DC

## 2020-05-11 MED ORDER — IPRATROPIUM BROMIDE 0.03 % NA SOLN: 2.0000 | Freq: Two times a day (BID) | NASAL | 12 refills | Status: DC

## 2020-05-12 ENCOUNTER — Encounter: Payer: Self-pay | Admitting: Family Medicine

## 2020-05-12 LAB — COMPREHENSIVE METABOLIC PANEL
AG Ratio: 1.4 (calc) (ref 1.0–2.5)
ALT: 22 U/L (ref 6–29)
AST: 24 U/L (ref 10–35)
Albumin: 4.1 g/dL (ref 3.6–5.1)
Alkaline phosphatase (APISO): 64 U/L (ref 37–153)
BUN/Creatinine Ratio: 17 (calc) (ref 6–22)
BUN: 16 mg/dL (ref 7–25)
CO2: 24 mmol/L (ref 20–32)
Calcium: 10 mg/dL (ref 8.6–10.4)
Chloride: 106 mmol/L (ref 98–110)
Creat: 0.96 mg/dL — ABNORMAL HIGH (ref 0.60–0.93)
Globulin: 2.9 g/dL (calc) (ref 1.9–3.7)
Glucose, Bld: 83 mg/dL (ref 65–99)
Potassium: 4 mmol/L (ref 3.5–5.3)
Sodium: 140 mmol/L (ref 135–146)
Total Bilirubin: 0.5 mg/dL (ref 0.2–1.2)
Total Protein: 7 g/dL (ref 6.1–8.1)

## 2020-05-12 LAB — HEMOGLOBIN A1C
Hgb A1c MFr Bld: 5.3 % of total Hgb (ref <5.7)
Mean Plasma Glucose: 105 (calc)
eAG (mmol/L): 5.8 (calc)

## 2020-05-12 LAB — VITAMIN D 25 HYDROXY (VIT D DEFICIENCY, FRACTURES): Vit D, 25-Hydroxy: 39 ng/mL (ref 30–100)

## 2020-05-12 LAB — CBC
HCT: 45.5 % — ABNORMAL HIGH (ref 35.0–45.0)
Hemoglobin: 15.6 g/dL — ABNORMAL HIGH (ref 11.7–15.5)
MCH: 31.9 pg (ref 27.0–33.0)
MCHC: 34.3 g/dL (ref 32.0–36.0)
MCV: 93 fL (ref 80.0–100.0)
MPV: 10.3 fL (ref 7.5–12.5)
Platelets: 231 10*3/uL (ref 140–400)
RBC: 4.89 10*6/uL (ref 3.80–5.10)
RDW: 13.1 % (ref 11.0–15.0)
WBC: 7.1 10*3/uL (ref 3.8–10.8)

## 2020-05-12 LAB — LIPID PANEL
Cholesterol: 287 mg/dL — ABNORMAL HIGH (ref <200)
HDL: 55 mg/dL (ref >=50)
LDL Cholesterol (Calc): 195 mg/dL (calc) — ABNORMAL HIGH
Non-HDL Cholesterol (Calc): 232 mg/dL (calc) — ABNORMAL HIGH (ref <130)
Total CHOL/HDL Ratio: 5.2 (calc) — ABNORMAL HIGH (ref <5.0)
Triglycerides: 193 mg/dL — ABNORMAL HIGH (ref <150)

## 2020-05-12 LAB — TSH: TSH: 2.82 mIU/L (ref 0.40–4.50)

## 2020-05-17 ENCOUNTER — Ambulatory Visit (HOSPITAL_BASED_OUTPATIENT_CLINIC_OR_DEPARTMENT_OTHER): Payer: Medicare Other

## 2020-05-18 ENCOUNTER — Other Ambulatory Visit: Payer: Self-pay

## 2020-05-18 ENCOUNTER — Encounter: Payer: Self-pay | Admitting: Family Medicine

## 2020-05-18 ENCOUNTER — Ambulatory Visit (HOSPITAL_BASED_OUTPATIENT_CLINIC_OR_DEPARTMENT_OTHER)
Admission: RE | Admit: 2020-05-18 | Discharge: 2020-05-18 | Disposition: A | Discharge status: Home / Self Care | Payer: Medicare Other | Source: Ambulatory Visit | Attending: Family Medicine | Admitting: Family Medicine

## 2020-05-18 DIAGNOSIS — Z1231 Encounter for screening mammogram for malignant neoplasm of breast: Secondary | ICD-10-CM | POA: Insufficient documentation

## 2020-06-19 ENCOUNTER — Encounter: Payer: Self-pay | Admitting: Family Medicine

## 2020-06-19 DIAGNOSIS — F4321 Adjustment disorder with depressed mood: Secondary | ICD-10-CM

## 2020-06-20 MED ORDER — FLUOXETINE HCL 40 MG PO CAPS: 40.0000 mg | ORAL_CAPSULE | Freq: Every day | ORAL | 1 refills | Status: DC

## 2020-06-20 NOTE — Addendum Note (Signed)
Addended by: Lamar Blinks C on: 06/20/2020 07:45 PM   Modules accepted: Orders

## 2020-06-28 ENCOUNTER — Other Ambulatory Visit: Payer: Self-pay | Admitting: Family Medicine

## 2020-06-28 DIAGNOSIS — I10 Essential (primary) hypertension: Secondary | ICD-10-CM

## 2020-07-19 ENCOUNTER — Telehealth: Payer: Self-pay | Admitting: Family Medicine

## 2020-07-19 NOTE — Telephone Encounter (Signed)
Caller : [facility]  Call Back # [phone]  Patient is calling inquiring about prolia injection and flu shot, patient would like to know if she can have them together.

## 2020-07-20 NOTE — Telephone Encounter (Signed)
Sent patient mychart message

## 2020-08-04 ENCOUNTER — Ambulatory Visit (INDEPENDENT_AMBULATORY_CARE_PROVIDER_SITE_OTHER): Payer: Medicare Other

## 2020-08-04 ENCOUNTER — Other Ambulatory Visit: Payer: Self-pay

## 2020-08-04 DIAGNOSIS — M810 Age-related osteoporosis without current pathological fracture: Secondary | ICD-10-CM

## 2020-08-04 MED ORDER — DENOSUMAB 60 MG/ML ~~LOC~~ SOSY
60.0000 mg | PREFILLED_SYRINGE | Freq: Once | SUBCUTANEOUS | Status: AC
  Administered 2020-08-04: 60 mg via SUBCUTANEOUS

## 2020-08-04 NOTE — Progress Notes (Signed)
Pt is here today for prolia injection. Pt was given prolia injection in left subq. Pt tolerated well 

## 2020-08-07 ENCOUNTER — Ambulatory Visit: Payer: Medicare Other | Attending: Internal Medicine

## 2020-08-07 ENCOUNTER — Other Ambulatory Visit (HOSPITAL_BASED_OUTPATIENT_CLINIC_OR_DEPARTMENT_OTHER): Payer: Self-pay | Admitting: Internal Medicine

## 2020-08-07 DIAGNOSIS — Z23 Encounter for immunization: Secondary | ICD-10-CM

## 2020-08-09 ENCOUNTER — Encounter: Payer: Self-pay | Admitting: Family Medicine

## 2020-08-09 ENCOUNTER — Ambulatory Visit (INDEPENDENT_AMBULATORY_CARE_PROVIDER_SITE_OTHER): Payer: Medicare Other

## 2020-08-09 ENCOUNTER — Other Ambulatory Visit: Payer: Self-pay

## 2020-08-09 DIAGNOSIS — Z23 Encounter for immunization: Secondary | ICD-10-CM

## 2020-08-09 NOTE — Progress Notes (Signed)
Pt received high dose flu shot today. Tolerated well. VIS provided to pt

## 2020-08-15 MED FILL — MODERNA COVID-19 VACCINE 10: 100 | 1 days supply | Qty: 0 | Fill #0

## 2020-08-24 ENCOUNTER — Other Ambulatory Visit: Payer: Self-pay | Admitting: Family Medicine

## 2020-08-24 DIAGNOSIS — F4321 Adjustment disorder with depressed mood: Secondary | ICD-10-CM

## 2020-09-13 ENCOUNTER — Encounter: Payer: Self-pay | Admitting: Family Medicine

## 2020-09-22 MED ORDER — BUPROPION HCL ER (XL) 150 MG PO TB24
300.0000 mg | ORAL_TABLET | Freq: Every day | ORAL | 2 refills | Status: DC
Start: 2020-09-22 — End: 2021-01-17

## 2020-09-22 NOTE — Addendum Note (Signed)
Addended by: Lamar Blinks C on: 09/22/2020 12:06 PM   Modules accepted: Orders

## 2020-10-02 DIAGNOSIS — H52223 Regular astigmatism, bilateral: Secondary | ICD-10-CM | POA: Diagnosis not present

## 2020-10-02 DIAGNOSIS — H5213 Myopia, bilateral: Secondary | ICD-10-CM | POA: Diagnosis not present

## 2020-10-02 DIAGNOSIS — H524 Presbyopia: Secondary | ICD-10-CM | POA: Diagnosis not present

## 2020-10-02 DIAGNOSIS — H35341 Macular cyst, hole, or pseudohole, right eye: Secondary | ICD-10-CM | POA: Diagnosis not present

## 2020-10-06 DIAGNOSIS — H33192 Other retinoschisis and retinal cysts, left eye: Secondary | ICD-10-CM | POA: Diagnosis not present

## 2020-10-06 DIAGNOSIS — H35341 Macular cyst, hole, or pseudohole, right eye: Secondary | ICD-10-CM | POA: Diagnosis not present

## 2020-10-06 DIAGNOSIS — H43812 Vitreous degeneration, left eye: Secondary | ICD-10-CM | POA: Diagnosis not present

## 2020-10-06 DIAGNOSIS — H2513 Age-related nuclear cataract, bilateral: Secondary | ICD-10-CM | POA: Diagnosis not present

## 2020-10-16 DIAGNOSIS — H35341 Macular cyst, hole, or pseudohole, right eye: Secondary | ICD-10-CM | POA: Diagnosis not present

## 2020-10-16 DIAGNOSIS — H35033 Hypertensive retinopathy, bilateral: Secondary | ICD-10-CM | POA: Diagnosis not present

## 2020-10-24 DIAGNOSIS — Z01818 Encounter for other preprocedural examination: Secondary | ICD-10-CM | POA: Diagnosis not present

## 2020-10-27 DIAGNOSIS — Z90710 Acquired absence of both cervix and uterus: Secondary | ICD-10-CM | POA: Diagnosis not present

## 2020-10-27 DIAGNOSIS — F32A Depression, unspecified: Secondary | ICD-10-CM | POA: Diagnosis not present

## 2020-10-27 DIAGNOSIS — H35341 Macular cyst, hole, or pseudohole, right eye: Secondary | ICD-10-CM | POA: Diagnosis not present

## 2020-10-27 DIAGNOSIS — I10 Essential (primary) hypertension: Secondary | ICD-10-CM | POA: Diagnosis not present

## 2020-10-27 DIAGNOSIS — Z7289 Other problems related to lifestyle: Secondary | ICD-10-CM | POA: Diagnosis not present

## 2020-10-27 DIAGNOSIS — M1990 Unspecified osteoarthritis, unspecified site: Secondary | ICD-10-CM | POA: Diagnosis not present

## 2020-10-27 DIAGNOSIS — Z01810 Encounter for preprocedural cardiovascular examination: Secondary | ICD-10-CM | POA: Diagnosis not present

## 2021-01-16 ENCOUNTER — Other Ambulatory Visit: Payer: Self-pay | Admitting: Family Medicine

## 2021-01-17 DIAGNOSIS — Z23 Encounter for immunization: Secondary | ICD-10-CM | POA: Diagnosis not present

## 2021-02-06 ENCOUNTER — Ambulatory Visit (INDEPENDENT_AMBULATORY_CARE_PROVIDER_SITE_OTHER): Payer: Medicare Other | Admitting: *Deleted

## 2021-02-06 ENCOUNTER — Other Ambulatory Visit: Payer: Self-pay

## 2021-02-06 DIAGNOSIS — M810 Age-related osteoporosis without current pathological fracture: Secondary | ICD-10-CM

## 2021-02-06 MED ORDER — DENOSUMAB 60 MG/ML ~~LOC~~ SOSY
60.0000 mg | PREFILLED_SYRINGE | Freq: Once | SUBCUTANEOUS | Status: AC
  Administered 2021-02-06: 60 mg via SUBCUTANEOUS

## 2021-02-06 NOTE — Progress Notes (Signed)
Patient here for prolia injection per Dr. Lorelei Pont.  Injection given in left arm and patient tolerated well.  Patient was advised to call 1 week prior to 08/09/21 to schedule next appointment.

## 2021-02-07 NOTE — Progress Notes (Addendum)
Subjective:   Rebecca Lopez is a 75 y.o. female who presents for Medicare Annual (Subsequent) preventive examination.  Review of Systems     Cardiac Risk Factors include: advanced age (>63men, >62 women);hypertension;dyslipidemia;obesity (BMI >30kg/m2);sedentary lifestyle     Objective:    Today's Vitals   02/08/21 1326 02/08/21 1329  BP: 122/84   Pulse: 78   Resp: 16   Temp: (!) 97.3 F (36.3 C)   TempSrc: Temporal   SpO2: 99%   Weight: 183 lb 6.4 oz (83.2 kg)   Height: 5\' 3"  (1.6 m)   PainSc:  0-No pain   Body mass index is 32.49 kg/m.  Advanced Directives 02/08/2021 09/28/2019 05/24/2018 10/31/2016  Does Patient Have a Medical Advance Directive? No No No No  Does patient want to make changes to medical advance directive? Yes (MAU/Ambulatory/Procedural Areas - Information given) - - -  Would patient like information on creating a medical advance directive? - No - Patient declined No - Patient declined Yes (MAU/Ambulatory/Procedural Areas - Information given)    Current Medications (verified) Outpatient Encounter Medications as of 02/08/2021  Medication Sig   buPROPion (WELLBUTRIN XL) 150 MG 24 hr tablet Take 2 tablets (300 mg total) by mouth daily.   cholecalciferol (VITAMIN D) 1000 units tablet Take 1,000 Units by mouth daily.   COVID-19 mRNA vaccine, Moderna, 100 MCG/0.5ML injection INJECT AS DIRECTED   denosumab (PROLIA) 60 MG/ML SOSY injection Inject 60 mg into the skin every 6 (six) months.   ipratropium (ATROVENT) 0.03 % nasal spray Place 2 sprays into both nostrils every 12 (twelve) hours.   losartan-hydrochlorothiazide (HYZAAR) 50-12.5 MG tablet TAKE ONE-HALF TABLETS BY MOUTH DAILY   Multiple Vitamin (MULTIVITAMIN) tablet Take 1 tablet by mouth daily.   No facility-administered encounter medications on file as of 02/08/2021.    Allergies (verified) Amlodipine and Fosamax [alendronate sodium]   History: Past Medical History:  Diagnosis Date   Allergy    Anemia     before hysterectomy    Arthritis    Cataract    beginnings    Chronic kidney disease    kidney stone   Depression    Duodenal ulcer    GERD (gastroesophageal reflux disease)    Hyperlipidemia    Hypertension    Neuromuscular disorder (HCC)    neuropathy, CTS   Osteoporosis    Past Surgical History:  Procedure Laterality Date   ABDOMINAL HYSTERECTOMY     APPENDECTOMY     arm surgery     BARIATRIC SURGERY     COLONOSCOPY     CTR Bilateral 2005   had right hand done 1999   POLYPECTOMY     TONSILLECTOMY     Family History  Problem Relation Age of Onset   Hypertension Mother    Colon cancer Mother        pt states had no chemo, radiation    Colon polyps Mother    Heart disease Father    Cancer Sister    Colon cancer Sister 22       late 58's   Stroke Maternal Grandfather    Social History   Socioeconomic History   Marital status: Divorced    Spouse name: Not on file   Number of children: Not on file   Years of education: Not on file   Highest education level: Not on file  Occupational History   Not on file  Tobacco Use   Smoking status: Never   Smokeless tobacco: Never  Substance and Sexual Activity   Alcohol use: Yes    Comment: gin and wine 2x/month   Drug use: No   Sexual activity: Yes  Other Topics Concern   Not on file  Social History Narrative   Not on file   Social Determinants of Health   Financial Resource Strain: Low Risk    Difficulty of Paying Living Expenses: Not hard at all  Food Insecurity: No Food Insecurity   Worried About Charity fundraiser in the Last Year: Never true   [place] in the Last Year: Never true  Transportation Needs: No Transportation Needs   Lack of Transportation (Medical): No   Lack of Transportation (Non-Medical): No  Physical Activity: Inactive   Days of Exercise per Week: 0 days   Minutes of Exercise per Session: 0 min  Stress: No Stress Concern Present   Feeling of Stress : Not at all  Social  Connections: Socially Isolated   Frequency of Communication with Friends and Family: More than three times a week   Frequency of Social Gatherings with Friends and Family: Once a week   Attends Religious Services: Never   Marine scientist or Organizations: No   Attends Music therapist: Never   Marital Status: Divorced    Tobacco Counseling Counseling given: Not Answered   Clinical Intake:  Pre-visit preparation completed: Yes  Pain : 0-10 Pain Score: 0-No pain (none today but has been having some jaw pain x 2-3 weeks.) Pain Type: Acute pain Pain Location: Jaw Pain Onset: 1 to 4 weeks ago Pain Frequency: Intermittent     Nutritional Status: BMI > 30  Obese Nutritional Risks: None Diabetes: No  How often do you need to have someone help you when you read instructions, pamphlets, or other written materials from your doctor or pharmacy?: 1 - Never  Diabetic?No  Interpreter Needed?: No  Information entered by :: Caroleen Hamman LPN   Activities of Daily Living In your present state of health, do you have any difficulty performing the following activities: 02/08/2021  Hearing? N  Vision? N  Difficulty concentrating or making decisions? Y  Dressing or bathing? N  Doing errands, shopping? N  Preparing Food and eating ? N  Using the Toilet? N  In the past six months, have you accidently leaked urine? N  Do you have problems with loss of bowel control? N  Managing your Medications? N  Managing your Finances? N  Housekeeping or managing your Housekeeping? N  Some recent data might be hidden    Patient Care Team: Copland, Gay Filler, MD as PCP - General (Family Medicine)  Indicate any recent Medical Services you may have received from other than [facility] providers in the past year (date may be approximate).     Assessment:   This is a routine wellness examination for [facility].  Hearing/Vision screen Hearing Screening - Comments:: C/o mild hearing  loss Vision Screening - Comments:: Wears glasses Last eye exam-07/2020  Dietary issues and exercise activities discussed: Current Exercise Habits: The patient does not participate in regular exercise at present, Exercise limited by: None identified   Goals Addressed             This Visit's Progress    Increase physical activity   On track      Depression Screen PHQ 2/9 Scores 02/08/2021 09/28/2019 10/31/2016 03/24/2015  PHQ - 2 Score 1 0 0 0    Fall Risk Fall Risk  02/08/2021 09/28/2019  04/01/2019 10/31/2016  Falls in the past year? 1 0 1 Yes  Comment - - Emmi Telephone Survey: data to providers prior to load -  Number falls in past yr: 1 0 1 2 or more  Comment - - Emmi Telephone Survey Actual Response = 2 -  Injury with Fall? 0 0 0 No  Risk Factor Category  - - - High Fall Risk  Risk for fall due to : - - - History of fall(s)  Follow up Falls prevention discussed Education provided;Falls prevention discussed - -    FALL RISK PREVENTION PERTAINING TO THE HOME:  Any stairs in or around the home? Yes  If so, are there any without handrails? No  Home free of loose throw rugs in walkways, pet beds, electrical cords, etc? Yes  Adequate lighting in your home to reduce risk of falls? Yes   ASSISTIVE DEVICES UTILIZED TO PREVENT FALLS:  Life alert? No  Use of a cane, walker or w/c? No  Grab bars in the bathroom? Yes  Shower chair or bench in shower? No  Elevated toilet seat or a handicapped toilet? No   TIMED UP AND GO:  Was the test performed? Yes .  Length of time to ambulate 10 feet: 10 sec.   Gait steady and fast without use of assistive device  Cognitive Function:Normal cognitive status assessed by direct observation by this Nurse Health Advisor. No abnormalities found.       6CIT Screen 02/08/2021  What Year? 0 points  What month? 0 points  What time? 0 points  Count back from 20 0 points  Months in reverse 0 points  Repeat phrase 0 points  Total Score 0     Immunizations Immunization History  Administered Date(s) Administered   Fluad Quad(high Dose 65+) 05/31/2019, 08/09/2020   Influenza, High Dose Seasonal PF 09/25/2017, 07/08/2018   Influenza,inj,Quad PF,6+ Mos 08/10/2013, 08/16/2014   Moderna SARS-COV2 Booster Vaccination 08/07/2020   Moderna Sars-Covid-2 Vaccination 10/14/2019, 11/12/2019   PFIZER(Purple Top)SARS-COV-2 Vaccination 01/17/2021   Pneumococcal Conjugate-13 10/31/2016   Pneumococcal Polysaccharide-23 07/08/2018   Tdap 03/24/2015    TDAP status: Up to date  Flu Vaccine status: Up to date  Pneumococcal vaccine status: Up to date  Covid-19 vaccine status: Completed vaccines  Qualifies for Shingles Vaccine? Yes   Zostavax completed No   Shingrix Completed?: No.    Education has been provided regarding the importance of this vaccine. Patient has been advised to call insurance company to determine out of pocket expense if they have not yet received this vaccine. Advised may also receive vaccine at local pharmacy or Health Dept. Verbalized acceptance and understanding.  Screening Tests Health Maintenance  Topic Date Due   Zoster Vaccines- Shingrix (1 of 2) Never done   INFLUENZA VACCINE  04/02/2021   MAMMOGRAM  05/18/2022   COLONOSCOPY (Pts 45-27yrs Insurance coverage will need to be confirmed)  08/15/2023   TETANUS/TDAP  03/23/2025   DEXA SCAN  Completed   COVID-19 Vaccine  Completed   Hepatitis C Screening  Completed   PNA vac Low Risk Adult  Completed   Pneumococcal Vaccine 47-29 Years old  Aged Out   HPV [place] Maintenance Due  Topic Date Due   Zoster Vaccines- Shingrix (1 of 2) Never done    Colorectal cancer screening: Type of screening: Colonoscopy. Completed 08/14/2018. Repeat every 5 years  Mammogram status: Completed Bilateral 05/18/2020. Repeat every year  Bone Density status:  Due-Declined today. Patient states she will complete at a later  date.   Lung Cancer Screening: (Low Dose CT Chest recommended if Age 71-80 years, 30 pack-year currently smoking OR have quit w/in 15years.) does not qualify.    Additional Screening:  Hepatitis C Screening: Completed 07/26/2013  Vision Screening: Recommended annual ophthalmology exams for early detection of glaucoma and other disorders of the eye. Is the patient up to date with their annual eye exam?  Yes  Who is the provider or what is the name of the office in which the patient attends annual eye exams? Dr. Sabra Heck   Dental Screening: Recommended annual dental exams for proper oral hygiene  Community Resource Referral / Chronic Care Management: CRR required this visit?  No   CCM required this visit?  No      Plan:     I have personally reviewed and noted the following in the patient's chart:   Medical and social history Use of alcohol, tobacco or illicit drugs  Current medications and supplements including opioid prescriptions.  Functional ability and status Nutritional status Physical activity Advanced directives List of other physicians Hospitalizations, surgeries, and ER visits in previous 12 months Vitals Screenings to include cognitive, depression, and falls Referrals and appointments  In addition, I have reviewed and discussed with patient certain preventive protocols, quality metrics, and best practice recommendations. A written personalized care plan for preventive services as well as general preventive health recommendations were provided to patient.   Patient would like to access avs on mychart.   Marta Antu, LPN   02/01/7034  Nurse Health Advisor  Nurse Notes: Patient has questions about Prolia & if it could be causing her jaw pain. Patient made an appt to see PCP.   Medical screening examination/treatment/procedure(s) were performed by non-physician practitioner and as supervising provider I was immediately available for  consultation/collaboration.  I agree with above. Marrian Salvage, FNP

## 2021-02-08 ENCOUNTER — Ambulatory Visit (INDEPENDENT_AMBULATORY_CARE_PROVIDER_SITE_OTHER): Payer: Medicare Other

## 2021-02-08 ENCOUNTER — Other Ambulatory Visit: Payer: Self-pay

## 2021-02-08 VITALS — BP 122/84 | HR 78 | Temp 97.3°F | Resp 16 | Ht 63.0 in | Wt 183.4 lb

## 2021-02-08 DIAGNOSIS — Z0000 Encounter for general adult medical examination without abnormal findings: Secondary | ICD-10-CM | POA: Diagnosis not present

## 2021-02-08 NOTE — Patient Instructions (Signed)
[name] , Thank you for taking time to come for your Medicare Wellness Visit. I appreciate your ongoing commitment to your health goals. Please review the following plan we discussed and let me know if I can assist you in the future.   Screening recommendations/referrals: Colonoscopy: Completed 08/14/2018-Due 08/15/2023 Mammogram: Completed 05/18/2020-Due 05/18/2021 Bone Density: Due-Postponed until a later date. Please call the office when you are ready to schedule. Recommended yearly ophthalmology/optometry visit for glaucoma screening and checkup Recommended yearly dental visit for hygiene and checkup  Vaccinations: Influenza vaccine: Up to date Pneumococcal vaccine: Up to date Tdap vaccine: Up to date-Due 03/23/2025 Shingles vaccine: Discuss with pharmacy   Covid-19:Up to date  Advanced directives: Information given today  Conditions/risks identified: See problem list  Next appointment: Follow up in one year for your annual wellness visit 02/12/22 @ 1:00   Preventive Care 65 Years and Older, Female Preventive care refers to lifestyle choices and visits with your health care provider that can promote health and wellness. What does preventive care include? A yearly physical exam. This is also called an annual well check. Dental exams once or twice a year. Routine eye exams. Ask your health care provider how often you should have your eyes checked. Personal lifestyle choices, including: Daily care of your teeth and gums. Regular physical activity. Eating a healthy diet. Avoiding tobacco and drug use. Limiting alcohol use. Practicing safe sex. Taking low-dose aspirin every day. Taking vitamin and mineral supplements as recommended by your health care provider. What happens during an annual well check? The services and screenings done by your health care provider during your annual well check will depend on your age, overall health, lifestyle risk factors, and family history of  disease. Counseling  Your health care provider may ask you questions about your: Alcohol use. Tobacco use. Drug use. Emotional well-being. Home and relationship well-being. Sexual activity. Eating habits. History of falls. Memory and ability to understand (cognition). Work and work Statistician. Reproductive health. Screening  You may have the following tests or measurements: Height, weight, and BMI. Blood pressure. Lipid and cholesterol levels. These may be checked every 5 years, or more frequently if you are over 44 years old. Skin check. Lung cancer screening. You may have this screening every year starting at age 44 if you have a 30-pack-year history of smoking and currently smoke or have quit within the past 15 years. Fecal occult blood test (FOBT) of the stool. You may have this test every year starting at age 71. Flexible sigmoidoscopy or colonoscopy. You may have a sigmoidoscopy every 5 years or a colonoscopy every 10 years starting at age 35. Hepatitis C blood test. Hepatitis B blood test. Sexually transmitted disease (STD) testing. Diabetes screening. This is done by checking your blood sugar (glucose) after you have not eaten for a while (fasting). You may have this done every 1-3 years. Bone density scan. This is done to screen for osteoporosis. You may have this done starting at age 69. Mammogram. This may be done every 1-2 years. Talk to your health care provider about how often you should have regular mammograms. Talk with your health care provider about your test results, treatment options, and if necessary, the need for more tests. Vaccines  Your health care provider may recommend certain vaccines, such as: Influenza vaccine. This is recommended every year. Tetanus, diphtheria, and acellular pertussis (Tdap, Td) vaccine. You may need a Td booster every 10 years. Zoster vaccine. You may need this after age 66.  Pneumococcal 13-valent conjugate (PCV13) vaccine. One  dose is recommended after age 44. Pneumococcal polysaccharide (PPSV23) vaccine. One dose is recommended after age 43. Talk to your health care provider about which screenings and vaccines you need and how often you need them. This information is not intended to replace advice given to you by your health care provider. Make sure you discuss any questions you have with your health care provider. Document Released: 09/15/2015 Document Revised: 05/08/2016 Document Reviewed: 06/20/2015 Elsevier Interactive Patient Education  2017 [place] Prevention in the Home Falls can cause injuries. They can happen to people of all ages. There are many things you can do to make your home safe and to help prevent falls. What can I do on the outside of my home? Regularly fix the edges of walkways and driveways and fix any cracks. Remove anything that might make you trip as you walk through a door, such as a raised step or threshold. Trim any bushes or trees on the path to your home. Use bright outdoor lighting. Clear any walking paths of anything that might make someone trip, such as rocks or tools. Regularly check to see if handrails are loose or broken. Make sure that both sides of any steps have handrails. Any raised decks and porches should have guardrails on the edges. Have any leaves, snow, or ice cleared regularly. Use sand or salt on walking paths during winter. Clean up any spills in your garage right away. This includes oil or grease spills. What can I do in the bathroom? Use night lights. Install grab bars by the toilet and in the tub and shower. Do not use towel bars as grab bars. Use non-skid mats or decals in the tub or shower. If you need to sit down in the shower, use a plastic, non-slip stool. Keep the floor dry. Clean up any water that spills on the floor as soon as it happens. Remove soap buildup in the tub or shower regularly. Attach bath mats securely with double-sided  non-slip rug tape. Do not have throw rugs and other things on the floor that can make you trip. What can I do in the bedroom? Use night lights. Make sure that you have a light by your bed that is easy to reach. Do not use any sheets or blankets that are too big for your bed. They should not hang down onto the floor. Have a firm chair that has side arms. You can use this for support while you get dressed. Do not have throw rugs and other things on the floor that can make you trip. What can I do in the kitchen? Clean up any spills right away. Avoid walking on wet floors. Keep items that you use a lot in easy-to-reach places. If you need to reach something above you, use a strong step stool that has a grab bar. Keep electrical cords out of the way. Do not use floor polish or wax that makes floors slippery. If you must use wax, use non-skid floor wax. Do not have throw rugs and other things on the floor that can make you trip. What can I do with my stairs? Do not leave any items on the stairs. Make sure that there are handrails on both sides of the stairs and use them. Fix handrails that are broken or loose. Make sure that handrails are as long as the stairways. Check any carpeting to make sure that it is firmly attached to the stairs. Fix any  carpet that is loose or worn. Avoid having throw rugs at the top or bottom of the stairs. If you do have throw rugs, attach them to the floor with carpet tape. Make sure that you have a light switch at the top of the stairs and the bottom of the stairs. If you do not have them, ask someone to add them for you. What else can I do to help prevent falls? Wear shoes that: Do not have high heels. Have rubber bottoms. Are comfortable and fit you well. Are closed at the toe. Do not wear sandals. If you use a stepladder: Make sure that it is fully opened. Do not climb a closed stepladder. Make sure that both sides of the stepladder are locked into place. Ask  someone to hold it for you, if possible. Clearly mark and make sure that you can see: Any grab bars or handrails. First and last steps. Where the edge of each step is. Use tools that help you move around (mobility aids) if they are needed. These include: Canes. Walkers. Scooters. Crutches. Turn on the lights when you go into a dark area. Replace any light bulbs as soon as they burn out. Set up your furniture so you have a clear path. Avoid moving your furniture around. If any of your floors are uneven, fix them. If there are any pets around you, be aware of where they are. Review your medicines with your doctor. Some medicines can make you feel dizzy. This can increase your chance of falling. Ask your doctor what other things that you can do to help prevent falls. This information is not intended to replace advice given to you by your health care provider. Make sure you discuss any questions you have with your health care provider. Document Released: 06/15/2009 Document Revised: 01/25/2016 Document Reviewed: 09/23/2014 Elsevier Interactive Patient Education  2017 [employer].

## 2021-02-23 DIAGNOSIS — H35033 Hypertensive retinopathy, bilateral: Secondary | ICD-10-CM | POA: Diagnosis not present

## 2021-02-23 DIAGNOSIS — H35341 Macular cyst, hole, or pseudohole, right eye: Secondary | ICD-10-CM | POA: Diagnosis not present

## 2021-03-22 ENCOUNTER — Other Ambulatory Visit: Payer: Self-pay | Admitting: Family Medicine

## 2021-03-22 NOTE — Progress Notes (Addendum)
[facility] [address] [place], [place] [address] [phone]  Date:  03/28/2021   Name:  Rebecca Lopez   DOB:  [birth year]   MRN:  [id number]  PCP:  Darreld Mclean, MD    Chief Complaint: Medical Management of Chronic Issues (Med refills ) and possible tmj    History of Present Illness:  Rebecca Lopez is a 75 y.o. very pleasant female patient who presents with the following:  Here today for a medication follow-up Last seen by myself in September  History of hypertension, hyperlipidemia, vitamin D deficiency, osteoporosis Her osteoporosis is being treated with Prolia At her last visit I started her on prozac 20 mg and then we changed over to wellbutrin in January due to lack of efficacy - she is taking one a day /150 mg of propranolol - she was not able to tolerate 2 pills due to tremor.  She still does note some symptoms of depression, but she thinks this is situational.  Overall she is happy with her current medication, does not wish to change.  No suicidal ideation  Shingrix -may not be affordable for patient  She had urgent eye surgery since our last visit- vitrectomy  She had surgery in February per Dr Baxter Flattery in [place]- this was successful but she is still having some cataract issues, they need to wait a year to remove this   She has noted some possible TMJ pain for 6 weeks Present on both sides but the right is the worst She does have some frontal sinus pain and pressure but no discharge It hurts when she opens her mouth wide or when she chews  She thinks she might have some tooth pain-she is not really sure Better in the am but gets worse as the day goes on She does not have routine dental care due to cost issues She has this issue back about 20 years ago when her husband left-she thinks she was clenching her teeth and grinding a lot at that time  She is taking advil and it helps She may take 1 or 2 Advil  daily  Lab Results  Component Value Date   HGBA1C 5.3 05/11/2020    Patient Active Problem List   Diagnosis Date Noted   Osteoporosis 09/30/2017   Hyperlipidemia 10/04/2014   Essential hypertension 10/04/2014   Vitamin D deficiency 10/04/2014   Family history of malignant neoplasm of gastrointestinal tract 03/10/2013    Past Medical History:  Diagnosis Date   Allergy    Anemia    before hysterectomy    Arthritis    Cataract    beginnings    Chronic kidney disease    kidney stone   Depression    Duodenal ulcer    GERD (gastroesophageal reflux disease)    Hyperlipidemia    Hypertension    Neuromuscular disorder ([facility])    neuropathy, CTS   Osteoporosis     Past Surgical History:  Procedure Laterality Date   ABDOMINAL HYSTERECTOMY     APPENDECTOMY     arm surgery     BARIATRIC SURGERY     COLONOSCOPY     CTR Bilateral 2005   had right hand done 1999   POLYPECTOMY     TONSILLECTOMY      Social History   Tobacco Use   Smoking status: Never   Smokeless tobacco: Never  Substance Use Topics   Alcohol use: Yes  Comment: gin and wine 2x/month   Drug use: No    Family History  Problem Relation Age of Onset   Hypertension Mother    Colon cancer Mother        pt states had no chemo, radiation    Colon polyps Mother    Heart disease Father    Cancer Sister    Colon cancer Sister 30       late 1's   Stroke Maternal Grandfather     Allergies  Allergen Reactions   Amlodipine     Possible small itchy spots on skin   Fosamax [Alendronate Sodium] Other (See Comments)    Achy after receiving     Medication list has been reviewed and updated.  Current Outpatient Medications on File Prior to Visit  Medication Sig Dispense Refill   buPROPion (WELLBUTRIN XL) 150 MG 24 hr tablet Take 2 tablets (300 mg total) by mouth daily. 60 tablet 0   cholecalciferol (VITAMIN D) 1000 units tablet Take 1,000 Units by mouth daily.     denosumab (PROLIA) 60 MG/ML SOSY  injection Inject 60 mg into the skin every 6 (six) months.     ipratropium (ATROVENT) 0.03 % nasal spray Place 2 sprays into both nostrils every 12 (twelve) hours. 30 mL 12   losartan-hydrochlorothiazide (HYZAAR) 50-12.5 MG tablet TAKE ONE-HALF TABLETS BY MOUTH DAILY 45 tablet 3   Multiple Vitamin (MULTIVITAMIN) tablet Take 1 tablet by mouth daily.     No current facility-administered medications on file prior to visit.    Review of Systems:  As per HPI- otherwise negative.   Physical Examination: Vitals:   03/28/21 1114  BP: 110/78  Pulse: 86  Temp: 98.3 F (36.8 C)  SpO2: 98%   Vitals:   03/28/21 1114  Weight: 181 lb 9.6 oz (82.4 kg)  Height: 5\' 3"  (1.6 m)   Body mass index is 32.17 kg/m. Ideal Body Weight: Weight in (lb) to have BMI = 25: 140.8  GEN: no acute distress.  Obese, otherwise looks well HEENT: Atraumatic, Normocephalic.  Bilateral TM wnl, oropharynx normal.  PEERL,EOMI. she has multiple dental restorations but I do not see any active caries.  None of her teeth are tender to pressure, no abscess is seen There is a palpable click and grind in her bilateral TMJ joints when she opens and closes her mouth Ears and Nose: No external deformity. CV: RRR, No M/G/R. No JVD. No thrill. No extra heart sounds. PULM: CTA B, no wheezes, crackles, rhonchi. No retractions. No resp. distress. No accessory muscle use. EXTR: No c/c/e PSYCH: Normally interactive. Conversant.    Assessment and Plan: TMJ pain dysfunction syndrome - Plan: DG TMJ Open & Close Bilateral  Vitamin D deficiency - Plan: VITAMIN D 25 Hydroxy (Vit-D Deficiency, Fractures)  Mixed hyperlipidemia - Plan: Lipid panel  Essential hypertension - Plan: CBC, Comprehensive metabolic panel  Abnormal glucose - Plan: Hemoglobin A1c  Patient seen today for concern of possible TMJ.  She is also on Prolia, need to consider osteonecrosis of the jaw. We will obtain plain films today, and I did advise her these are  not 100% sensitive for osteonecrosis.  If there are any suspicious findings on her plain film, or if symptoms continue we may need to look further with CT or MRI In the meantime she will try to meld score, also may try a higher regimen of ibuprofen for about 1 week  Routine labs are pending as above  This visit occurred during the  SARS-CoV-2 public health emergency.  Safety protocols were in place, including screening questions prior to the visit, additional usage of staff PPE, and extensive cleaning of exam room while observing appropriate contact time as indicated for disinfecting solutions.   Signed Lamar Blinks, MD  Received her labs as below, message to patient  Results for orders placed or performed in visit on 03/28/21  CBC  Result Value Ref Range   WBC 6.7 4.0 - 10.5 K/uL   RBC 4.65 3.87 - 5.11 Mil/uL   Platelets 217.0 150.0 - 400.0 K/uL   Hemoglobin 14.8 12.0 - 15.0 g/dL   HCT 44.0 36.0 - 46.0 %   MCV 94.7 78.0 - 100.0 fl   MCHC 33.6 30.0 - 36.0 g/dL   RDW 13.5 11.5 - 15.5 %  Comprehensive metabolic panel  Result Value Ref Range   Sodium 139 135 - 145 mEq/L   Potassium 3.8 3.5 - 5.1 mEq/L   Chloride 106 96 - 112 mEq/L   CO2 23 19 - 32 mEq/L   Glucose, Bld 111 (H) 70 - 99 mg/dL   BUN 15 6 - 23 mg/dL   Creatinine, Ser 0.95 0.40 - 1.20 mg/dL   Total Bilirubin 0.7 0.2 - 1.2 mg/dL   Alkaline Phosphatase 71 39 - 117 U/L   AST 18 0 - 37 U/L   ALT 14 0 - 35 U/L   Total Protein 6.8 6.0 - 8.3 g/dL   Albumin 4.0 3.5 - 5.2 g/dL   GFR 59.49 (L) >60.00 mL/min   Calcium 9.2 8.4 - 10.5 mg/dL  Hemoglobin A1c  Result Value Ref Range   Hgb A1c MFr Bld 5.3 4.6 - 6.5 %  Lipid panel  Result Value Ref Range   Cholesterol 249 (H) 0 - 200 mg/dL   Triglycerides 110.0 0.0 - 149.0 mg/dL   HDL 63.00 >39.00 mg/dL   VLDL 22.0 0.0 - 40.0 mg/dL   LDL Cholesterol 164 (H) 0 - 99 mg/dL   Total CHOL/HDL Ratio 4    NonHDL 186.20   VITAMIN D 25 Hydroxy (Vit-D Deficiency, Fractures)  Result  Value Ref Range   VITD 38.09 30.00 - 100.00 ng/mL    The 10-year ASCVD risk score Mikey Bussing DC Jr., et al., 2013) is: 13.3%   Values used to calculate the score:     Age: 39 years     Sex: Female     Is Non-Hispanic African American: No     Diabetic: No     Tobacco smoker: No     Systolic Blood Pressure: 878 mmHg     Is BP treated: Yes     HDL Cholesterol: 63 mg/dL     Total Cholesterol: 249 mg/dL

## 2021-03-28 ENCOUNTER — Other Ambulatory Visit: Payer: Self-pay

## 2021-03-28 ENCOUNTER — Ambulatory Visit (INDEPENDENT_AMBULATORY_CARE_PROVIDER_SITE_OTHER): Payer: Medicare Other | Admitting: Family Medicine

## 2021-03-28 ENCOUNTER — Encounter: Payer: Self-pay | Admitting: Family Medicine

## 2021-03-28 VITALS — BP 110/78 | HR 86 | Temp 98.3°F | Ht 63.0 in | Wt 181.6 lb

## 2021-03-28 DIAGNOSIS — E782 Mixed hyperlipidemia: Secondary | ICD-10-CM

## 2021-03-28 DIAGNOSIS — R7309 Other abnormal glucose: Secondary | ICD-10-CM

## 2021-03-28 DIAGNOSIS — E559 Vitamin D deficiency, unspecified: Secondary | ICD-10-CM | POA: Diagnosis not present

## 2021-03-28 DIAGNOSIS — I10 Essential (primary) hypertension: Secondary | ICD-10-CM | POA: Diagnosis not present

## 2021-03-28 DIAGNOSIS — M26629 Arthralgia of temporomandibular joint, unspecified side: Secondary | ICD-10-CM | POA: Diagnosis not present

## 2021-03-28 LAB — COMPREHENSIVE METABOLIC PANEL
ALT: 14 U/L (ref 0–35)
AST: 18 U/L (ref 0–37)
Albumin: 4 g/dL (ref 3.5–5.2)
Alkaline Phosphatase: 71 U/L (ref 39–117)
BUN: 15 mg/dL (ref 6–23)
CO2: 23 mEq/L (ref 19–32)
Calcium: 9.2 mg/dL (ref 8.4–10.5)
Chloride: 106 mEq/L (ref 96–112)
Creatinine, Ser: 0.95 mg/dL (ref 0.40–1.20)
GFR: 59.49 mL/min — ABNORMAL LOW (ref >60.00)
Glucose, Bld: 111 mg/dL — ABNORMAL HIGH (ref 70–99)
Potassium: 3.8 mEq/L (ref 3.5–5.1)
Sodium: 139 mEq/L (ref 135–145)
Total Bilirubin: 0.7 mg/dL (ref 0.2–1.2)
Total Protein: 6.8 g/dL (ref 6.0–8.3)

## 2021-03-28 LAB — CBC
HCT: 44 % (ref 36.0–46.0)
Hemoglobin: 14.8 g/dL (ref 12.0–15.0)
MCHC: 33.6 g/dL (ref 30.0–36.0)
MCV: 94.7 fl (ref 78.0–100.0)
Platelets: 217 10*3/uL (ref 150.0–400.0)
RBC: 4.65 Mil/uL (ref 3.87–5.11)
RDW: 13.5 % (ref 11.5–15.5)
WBC: 6.7 10*3/uL (ref 4.0–10.5)

## 2021-03-28 LAB — HEMOGLOBIN A1C: Hgb A1c MFr Bld: 5.3 % (ref 4.6–6.5)

## 2021-03-28 LAB — LIPID PANEL
Cholesterol: 249 mg/dL — ABNORMAL HIGH (ref 0–200)
HDL: 63 mg/dL (ref >39.00)
LDL Cholesterol: 164 mg/dL — ABNORMAL HIGH (ref 0–99)
NonHDL: 186.2
Total CHOL/HDL Ratio: 4
Triglycerides: 110 mg/dL (ref 0.0–149.0)
VLDL: 22 mg/dL (ref 0.0–40.0)

## 2021-03-28 LAB — VITAMIN D 25 HYDROXY (VIT D DEFICIENCY, FRACTURES): VITD: 38.09 ng/mL (ref 30.00–100.00)

## 2021-03-28 NOTE — Patient Instructions (Signed)
Good to see you today- I will be in touch with your labs Please go by [facility] at [address] for your x-ray You might try taking 400 mg of ibuprofen three times a day for a week or so- this may help calm down your jaw pain Also, a mouth guard and eating soft foods may help!    Consider getting the Shingrix vaccine at your pharmacy if affordable for you

## 2021-05-23 ENCOUNTER — Other Ambulatory Visit: Payer: Self-pay | Admitting: Family Medicine

## 2021-06-12 ENCOUNTER — Ambulatory Visit (INDEPENDENT_AMBULATORY_CARE_PROVIDER_SITE_OTHER): Payer: Medicare Other

## 2021-06-12 ENCOUNTER — Other Ambulatory Visit: Payer: Self-pay

## 2021-06-12 DIAGNOSIS — Z23 Encounter for immunization: Secondary | ICD-10-CM | POA: Diagnosis not present

## 2021-06-14 ENCOUNTER — Ambulatory Visit: Payer: Medicare Other | Attending: Internal Medicine

## 2021-06-14 DIAGNOSIS — Z23 Encounter for immunization: Secondary | ICD-10-CM

## 2021-06-14 NOTE — Progress Notes (Signed)
   Covid-19 Vaccination Clinic  Name:  ELESHIA WOOLEY    MRN: [id number] DOB: 09-Dec-1945  06/14/2021  Ms. Tarpley was observed post Covid-19 immunization for 15 minutes without incident. She was provided with Vaccine Information Sheet and instruction to access the V-Safe system.   Ms. Morrissey was instructed to call 911 with any severe reactions post vaccine: Difficulty breathing  Swelling of face and throat  A fast heartbeat  A bad rash all over body  Dizziness and weakness

## 2021-06-29 ENCOUNTER — Telehealth: Payer: Self-pay

## 2021-06-29 NOTE — Telephone Encounter (Signed)
Pt Reverified for Prolia, awaiting Summary of Benefits. Will call and schedule for appointment once received and reviewed with pt.

## 2021-07-06 ENCOUNTER — Other Ambulatory Visit (HOSPITAL_BASED_OUTPATIENT_CLINIC_OR_DEPARTMENT_OTHER): Payer: Self-pay

## 2021-07-06 DIAGNOSIS — Z23 Encounter for immunization: Secondary | ICD-10-CM | POA: Diagnosis not present

## 2021-07-06 MED ORDER — MODERNA COVID-19 BIVAL BOOSTER 50 MCG/0.5ML IM SUSP
INTRAMUSCULAR | 0 refills | Status: DC
  Filled 2021-07-06: qty 0.5, 1d supply, fill #0

## 2021-07-12 ENCOUNTER — Other Ambulatory Visit: Payer: Self-pay | Admitting: Family Medicine

## 2021-07-12 DIAGNOSIS — I10 Essential (primary) hypertension: Secondary | ICD-10-CM

## 2021-07-23 NOTE — Telephone Encounter (Signed)
Pt scheduled for prolia injection. 

## 2021-08-09 ENCOUNTER — Ambulatory Visit (INDEPENDENT_AMBULATORY_CARE_PROVIDER_SITE_OTHER): Payer: Medicare Other

## 2021-08-09 DIAGNOSIS — M810 Age-related osteoporosis without current pathological fracture: Secondary | ICD-10-CM | POA: Diagnosis not present

## 2021-08-09 MED ORDER — DENOSUMAB 60 MG/ML ~~LOC~~ SOSY
60.0000 mg | PREFILLED_SYRINGE | Freq: Once | SUBCUTANEOUS | Status: AC
Start: 2021-08-09 — End: 2021-08-09
  Administered 2021-08-09: 60 mg via SUBCUTANEOUS

## 2021-08-09 NOTE — Progress Notes (Signed)
Rebecca Lopez is a 75 y.o. female presents to the office today for prolia injections, per physician's orders. Original order: Per Dr. Lorelei Pont Denosumab (med), 60 mg/ml (dose),  subcute (route) was administered Left arm (location) today. Patient tolerated injection.  Patient next injection due: in 6 months, patient advised someone will call to set up appointment after verification of benefits.   Jiles Prows

## 2021-09-28 DIAGNOSIS — H2513 Age-related nuclear cataract, bilateral: Secondary | ICD-10-CM | POA: Diagnosis not present

## 2021-09-28 DIAGNOSIS — H35341 Macular cyst, hole, or pseudohole, right eye: Secondary | ICD-10-CM | POA: Diagnosis not present

## 2021-09-28 DIAGNOSIS — H35033 Hypertensive retinopathy, bilateral: Secondary | ICD-10-CM | POA: Diagnosis not present

## 2021-10-17 ENCOUNTER — Ambulatory Visit (INDEPENDENT_AMBULATORY_CARE_PROVIDER_SITE_OTHER): Payer: Medicare Other | Admitting: Family Medicine

## 2021-10-17 ENCOUNTER — Other Ambulatory Visit: Payer: Self-pay | Admitting: Family Medicine

## 2021-10-17 ENCOUNTER — Encounter: Payer: Self-pay | Admitting: Family Medicine

## 2021-10-17 ENCOUNTER — Ambulatory Visit: Payer: Medicare Other | Admitting: Family Medicine

## 2021-10-17 VITALS — BP 128/84 | HR 91 | Temp 99.0°F | Ht 62.0 in | Wt 178.0 lb

## 2021-10-17 DIAGNOSIS — R0982 Postnasal drip: Secondary | ICD-10-CM

## 2021-10-17 DIAGNOSIS — J0140 Acute pansinusitis, unspecified: Secondary | ICD-10-CM

## 2021-10-17 MED ORDER — AMOXICILLIN-POT CLAVULANATE 875-125 MG PO TABS: 1.0000 | ORAL_TABLET | Freq: Two times a day (BID) | ORAL | 0 refills | Status: AC

## 2021-10-17 MED ORDER — METHYLPREDNISOLONE ACETATE 80 MG/ML IJ SUSP
80.0000 mg | Freq: Once | INTRAMUSCULAR | Status: AC
  Administered 2021-10-17: 80 mg via INTRAMUSCULAR

## 2021-10-17 NOTE — Patient Instructions (Addendum)
Continue to push fluids, practice good hand hygiene, and cover your mouth if you cough.  If you start having fevers, shaking or shortness of breath, seek immediate care.  OK to take Tylenol 1000 mg (2 extra strength tabs) or 975 mg (3 regular strength tabs) every 6 hours as needed.  Hold off on the Advil until Friday.   Let us know if you need anything.

## 2021-10-17 NOTE — Progress Notes (Signed)
Chief Complaint  Patient presents with   Headache    Sinus Started on Friday    [name] here for URI complaints.  Duration: 6 days  Associated symptoms: sinus congestion, sinus pain, rhinorrhea, ear pain, sore throat, and dental pain, some nausea Denies: itchy watery eyes, ear drainage, wheezing, shortness of breath, myalgia, and fevers Treatment to date: Advil, sleeping Sick contacts: No Tested neg for covid.   Past Medical History:  Diagnosis Date   Allergy    Anemia    before hysterectomy    Arthritis    Cataract    beginnings    Chronic kidney disease    kidney stone   Depression    Duodenal ulcer    GERD (gastroesophageal reflux disease)    Hyperlipidemia    Hypertension    Neuromuscular disorder ([facility])    neuropathy, CTS   Osteoporosis     Objective BP 128/84    Pulse 91    Temp 99 F (37.2 C) (Oral)    Ht 5\' 2"  (1.575 m)    Wt 178 lb (80.7 kg)    SpO2 99%    BMI 32.56 kg/m  General: Awake, alert, appears stated age HEENT: AT, [place], ears patent b/l and TM's neg, nares patent w/o discharge, pharynx pink and without exudates, MMM; ttp over frontal and max sinuses b/l Neck: No masses or asymmetry Heart: RRR Lungs: CTAB, no accessory muscle use Psych: Age appropriate judgment and insight, normal mood and affect  Acute pansinusitis, recurrence not specified - Plan: amoxicillin-clavulanate (AUGMENTIN) 875-125 MG tablet, methylPREDNISolone acetate (DEPO-MEDROL) injection 80 mg  Continue to push fluids, practice good hand hygiene, cover mouth when coughing. F/u prn. If starting to experience fevers, shaking, or shortness of breath, seek immediate care. Pt voiced understanding and agreement to the plan.  [place], DO 10/17/21 9:48 AM

## 2021-11-13 DIAGNOSIS — H25043 Posterior subcapsular polar age-related cataract, bilateral: Secondary | ICD-10-CM | POA: Diagnosis not present

## 2021-11-13 DIAGNOSIS — H25013 Cortical age-related cataract, bilateral: Secondary | ICD-10-CM | POA: Diagnosis not present

## 2021-11-13 DIAGNOSIS — H18413 Arcus senilis, bilateral: Secondary | ICD-10-CM | POA: Diagnosis not present

## 2021-11-13 DIAGNOSIS — H2511 Age-related nuclear cataract, right eye: Secondary | ICD-10-CM | POA: Diagnosis not present

## 2021-11-13 DIAGNOSIS — H2513 Age-related nuclear cataract, bilateral: Secondary | ICD-10-CM | POA: Diagnosis not present

## 2021-12-13 ENCOUNTER — Telehealth: Payer: Self-pay

## 2021-12-13 NOTE — Telephone Encounter (Signed)
Last Prolia inj 08/09/21 ?Next Prolia inj due 02/08/22 ?

## 2021-12-29 ENCOUNTER — Other Ambulatory Visit: Payer: Self-pay | Admitting: Family Medicine

## 2022-01-06 ENCOUNTER — Other Ambulatory Visit: Payer: Self-pay | Admitting: Family Medicine

## 2022-01-06 DIAGNOSIS — I10 Essential (primary) hypertension: Secondary | ICD-10-CM

## 2022-01-07 IMAGING — MG DIGITAL SCREENING BILAT W/ TOMO W/ CAD
8 series · 9 of 24 positions shown · non-contrast
Comparison: Previous exam(s).

CLINICAL DATA: Screening.

EXAM:
DIGITAL SCREENING BILATERAL MAMMOGRAM WITH TOMO AND CAD

[R MLO synth-2D]
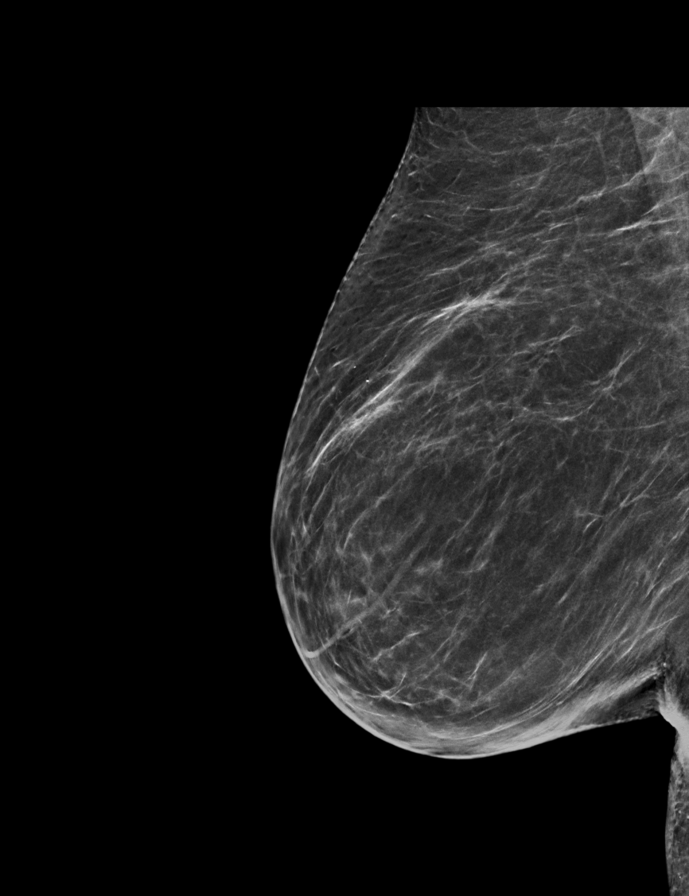

[L MLO synth-2D]
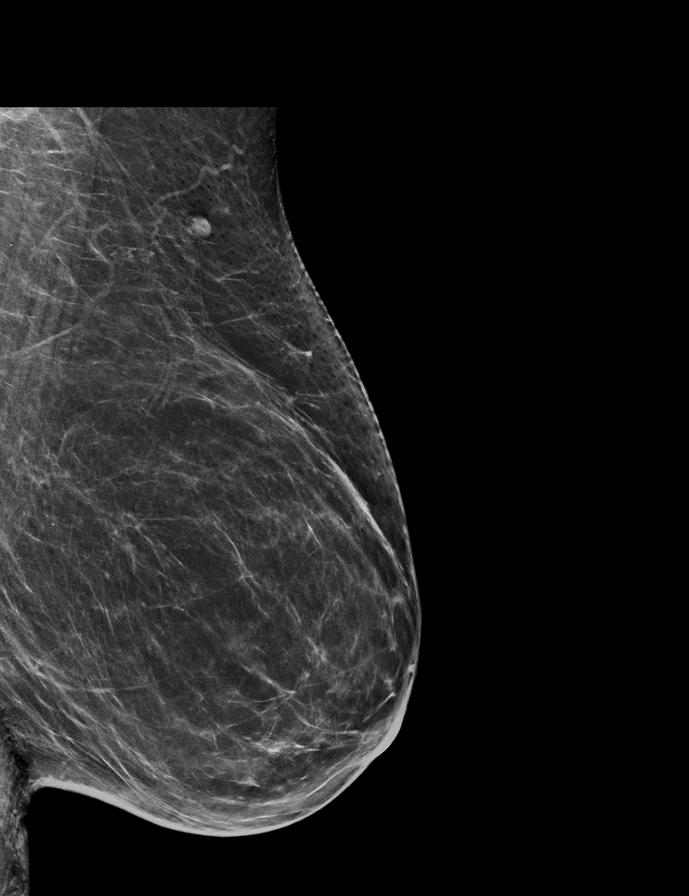

[R CC synth-2D]
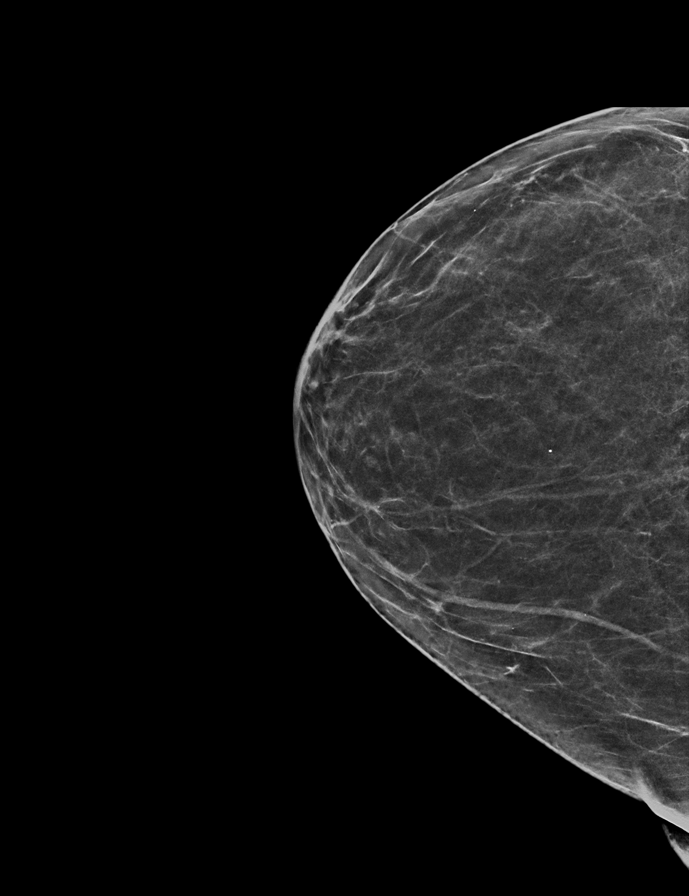

[L CC synth-2D]
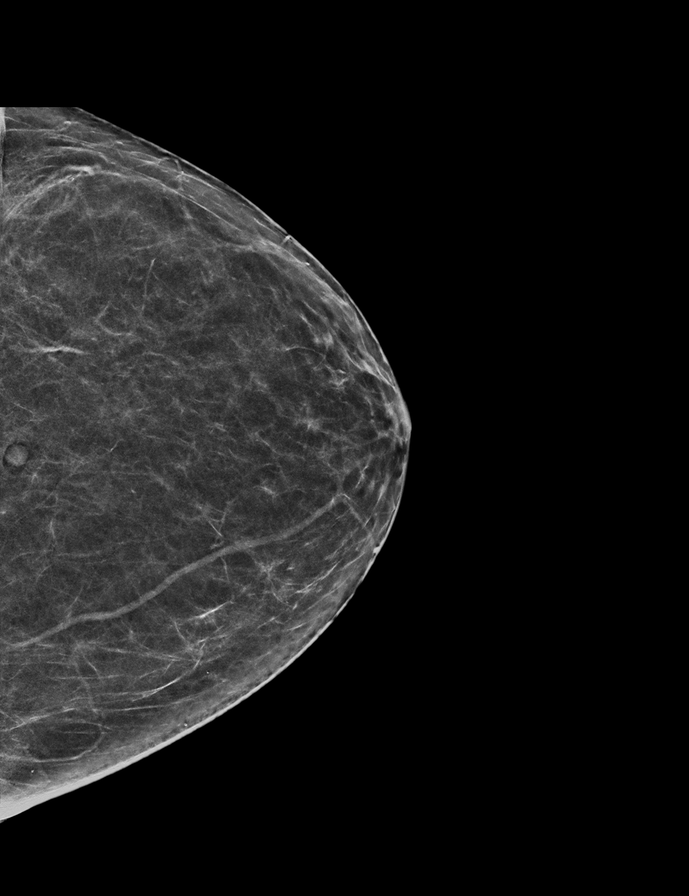

[R CC tomo · 2 of 53 frames shown]
[frame 18/53]
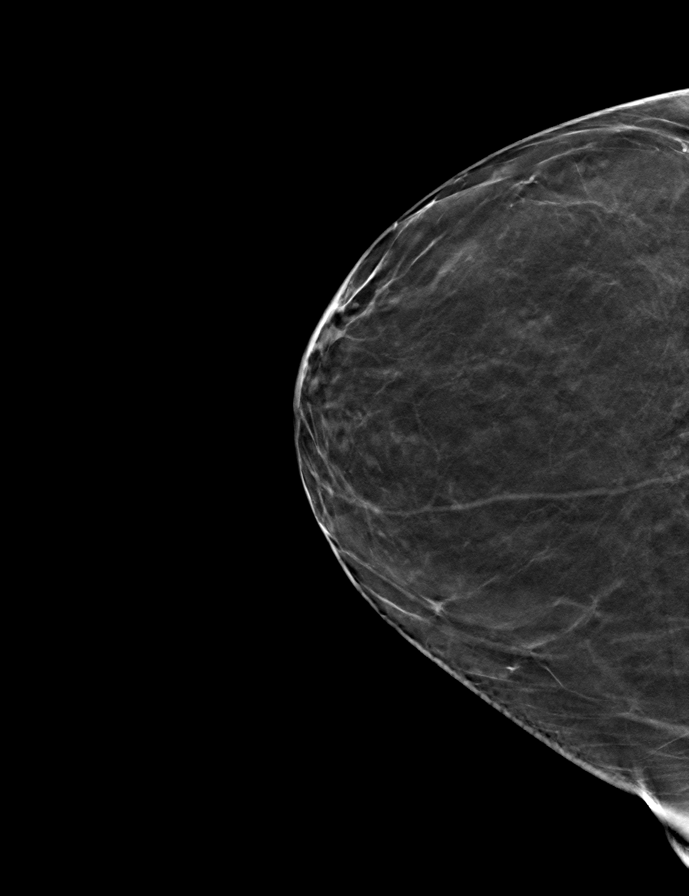
[frame 27/53]
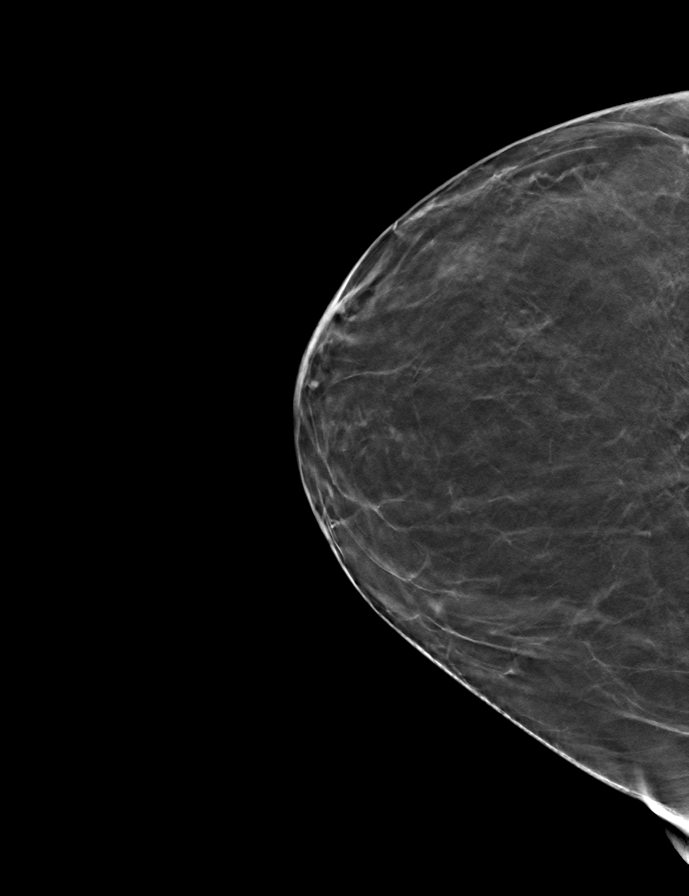

[L MLO tomo · tomo slice 32/63.0]
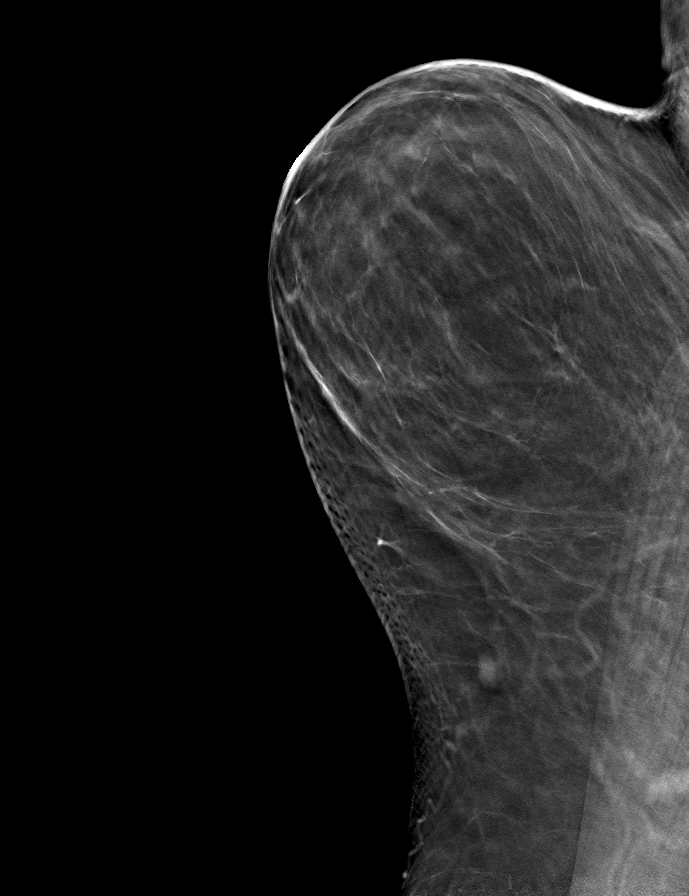

[R MLO tomo · tomo slice 33/65.0]
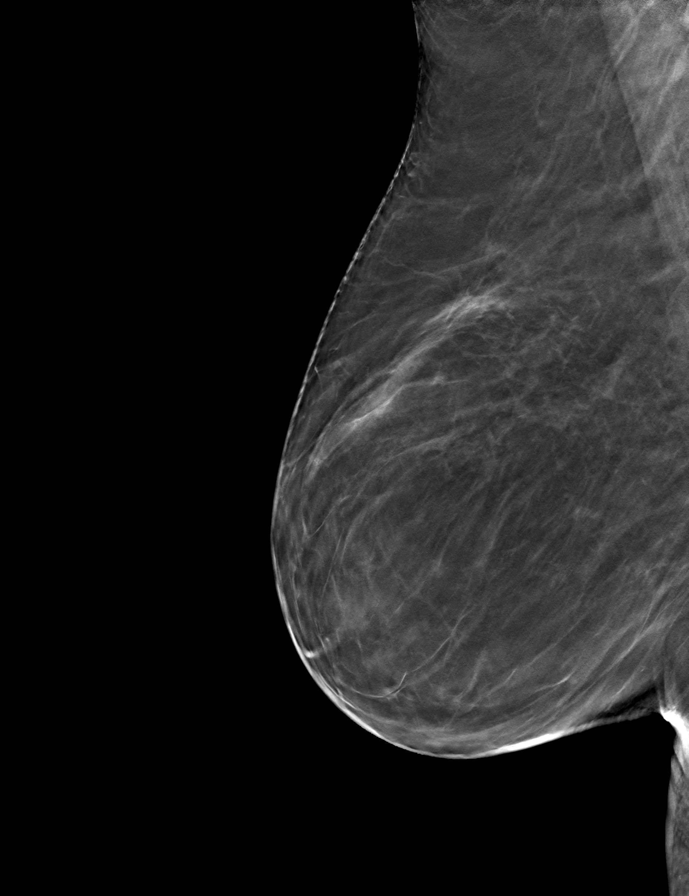

[L CC tomo · tomo slice 29/56.0]
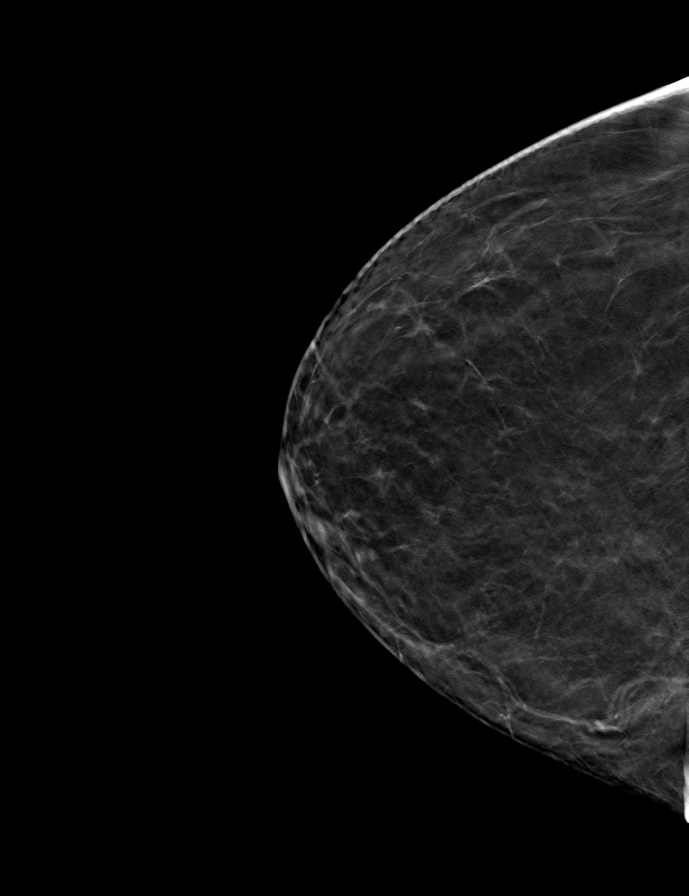

[9 of 24 positions shown; findings below may reference images not displayed]

ACR Breast Density Category b: There are scattered areas of
fibroglandular density.
FINDINGS: There are no findings suspicious for malignancy. Images were
processed with CAD.
IMPRESSION: No mammographic evidence of malignancy. A result letter of this
screening mammogram will be mailed directly to the patient.

RECOMMENDATION:
Screening mammogram in one year. (Code:[id number])

BI-RADS CATEGORY  1: Negative.

## 2022-01-15 NOTE — Telephone Encounter (Signed)
Prolia VOB initiated via [email or web address] ? ?Last Prolia inj 08/09/21 ?Next Prolia inj due 02/08/22 ?

## 2022-01-23 NOTE — Telephone Encounter (Signed)
Pt ready for scheduling on or after 02/08/22  Out-of-pocket cost due at time of visit: $0  Primary: Medicare Prolia co-insurance: 20% (approximately $276) Admin fee co-insurance: 20% (approximately $25)  Secondary: [employer] Medicare Supp Prolia co-insurance: Covers Medicare Part B co-insurance Admin fee co-insurance: Covers Medicare Part B co-insurance  Deductible: $226 of $226 met (NOT covered by secondary)  Prior Auth: not required PA# Valid:   ** This summary of benefits is an estimation of the patient's out-of-pocket cost. Exact cost may vary based on individual plan coverage.

## 2022-01-23 NOTE — Telephone Encounter (Signed)
Message sent via MyChart.

## 2022-02-04 DIAGNOSIS — H2511 Age-related nuclear cataract, right eye: Secondary | ICD-10-CM | POA: Diagnosis not present

## 2022-02-05 DIAGNOSIS — H2512 Age-related nuclear cataract, left eye: Secondary | ICD-10-CM | POA: Diagnosis not present

## 2022-02-12 ENCOUNTER — Ambulatory Visit: Payer: Medicare Other

## 2022-02-18 DIAGNOSIS — H2512 Age-related nuclear cataract, left eye: Secondary | ICD-10-CM | POA: Diagnosis not present

## 2022-03-06 ENCOUNTER — Ambulatory Visit (INDEPENDENT_AMBULATORY_CARE_PROVIDER_SITE_OTHER): Payer: Medicare Other

## 2022-03-06 DIAGNOSIS — M810 Age-related osteoporosis without current pathological fracture: Secondary | ICD-10-CM

## 2022-03-06 MED ORDER — DENOSUMAB 60 MG/ML ~~LOC~~ SOSY
60.0000 mg | PREFILLED_SYRINGE | Freq: Once | SUBCUTANEOUS | Status: AC
  Administered 2022-03-06: 60 mg via SUBCUTANEOUS

## 2022-03-06 NOTE — Progress Notes (Signed)
Rebecca Lopez is a 76 y.o. female presents to the office today for Prolia injections, per physician's orders. Original order:  Left Arm was administered today. Patient tolerated injection. Patient due for follow up labs/provider appt  Adriana Mccallum Zakia Sainato

## 2022-03-18 ENCOUNTER — Ambulatory Visit (INDEPENDENT_AMBULATORY_CARE_PROVIDER_SITE_OTHER): Payer: Medicare Other

## 2022-03-18 VITALS — Ht 62.0 in | Wt 178.0 lb

## 2022-03-18 DIAGNOSIS — Z0000 Encounter for general adult medical examination without abnormal findings: Secondary | ICD-10-CM | POA: Diagnosis not present

## 2022-03-18 DIAGNOSIS — Z1231 Encounter for screening mammogram for malignant neoplasm of breast: Secondary | ICD-10-CM

## 2022-03-18 DIAGNOSIS — Z780 Asymptomatic menopausal state: Secondary | ICD-10-CM | POA: Diagnosis not present

## 2022-03-18 NOTE — Progress Notes (Signed)
Subjective:   Rebecca Lopez is a 76 y.o. female who presents for Medicare Annual (Subsequent) preventive examination.  I connected with Doyce today by telephone and verified that I am speaking with the correct person using two identifiers. Location patient: home Location provider: work Persons participating in the virtual visit: patient, Marine scientist.    I discussed the limitations, risks, security and privacy concerns of performing an evaluation and management service by telephone and the availability of in person appointments. I also discussed with the patient that there may be a patient responsible charge related to this service. The patient expressed understanding and verbally consented to this telephonic visit.    Interactive audio and video telecommunications were attempted between this provider and patient, however failed, due to patient having technical difficulties OR patient did not have access to video capability.  We continued and completed visit with audio only.  Some vital signs may be absent or patient reported.   Time Spent with patient on telephone encounter: 20 minutes   Review of Systems     Cardiac Risk Factors include: advanced age (>32mn, >>17women);dyslipidemia;hypertension;obesity (BMI >30kg/m2)     Objective:    Today's Vitals   03/18/22 1129  Weight: 178 lb (80.7 kg)  Height: '5\' 2"'$  (1.575 m)   Body mass index is 32.56 kg/m.     03/18/2022   11:32 AM 02/08/2021    1:39 PM 09/28/2019   12:56 PM 05/24/2018    7:58 PM 10/31/2016    9:13 AM  Advanced Directives  Does Patient Have a Medical Advance Directive? No No No No No  Does patient want to make changes to medical advance directive?  Yes (MAU/Ambulatory/Procedural Areas - Information given)     Would patient like information on creating a medical advance directive?   No - Patient declined No - Patient declined Yes (MAU/Ambulatory/Procedural Areas - Information given)    Current Medications  (verified) Outpatient Encounter Medications as of 03/18/2022  Medication Sig   buPROPion (WELLBUTRIN XL) 150 MG 24 hr tablet TAKE TWO TABLETS BY MOUTH DAILY   denosumab (PROLIA) 60 MG/ML SOSY injection Inject 60 mg into the skin every 6 (six) months.   ipratropium (ATROVENT) 0.03 % nasal spray PLACE 2 SPRAYS INTO BOTH NOSTRILS EVERY 12 HOURS   losartan-hydrochlorothiazide (HYZAAR) 50-12.5 MG tablet TAKE 1/2 TABLET BY MOUTH DAILY   cholecalciferol (VITAMIN D) 1000 units tablet Take 1,000 Units by mouth daily. (Patient not taking: Reported on 03/18/2022)   COVID-19 mRNA bivalent vaccine, Moderna, (MODERNA COVID-19 BIVAL BOOSTER) 50 MCG/0.5ML injection Inject into the muscle. (Patient not taking: Reported on 03/18/2022)   Multiple Vitamin (MULTIVITAMIN) tablet Take 1 tablet by mouth daily. (Patient not taking: Reported on 03/18/2022)   No facility-administered encounter medications on file as of 03/18/2022.    Allergies (verified) Amlodipine and Fosamax [alendronate sodium]   History: Past Medical History:  Diagnosis Date   Allergy    Anemia    before hysterectomy    Arthritis    Cataract    beginnings    Chronic kidney disease    kidney stone   Depression    Duodenal ulcer    GERD (gastroesophageal reflux disease)    Hyperlipidemia    Hypertension    Neuromuscular disorder (HCC)    neuropathy, CTS   Osteoporosis    Past Surgical History:  Procedure Laterality Date   ABDOMINAL HYSTERECTOMY     APPENDECTOMY     arm surgery     BARIATRIC SURGERY  COLONOSCOPY     CTR Bilateral 2005   had right hand done 1999   EYE SURGERY     POLYPECTOMY     TONSILLECTOMY     Family History  Problem Relation Age of Onset   Hypertension Mother    Colon cancer Mother        pt states had no chemo, radiation    Colon polyps Mother    Heart disease Father    Cancer Sister    Colon cancer Sister 31       late 86's   Stroke Maternal Grandfather    Social History   Socioeconomic  History   Marital status: Divorced    Spouse name: Not on file   Number of children: Not on file   Years of education: Not on file   Highest education level: Not on file  Occupational History   Not on file  Tobacco Use   Smoking status: Never   Smokeless tobacco: Never  Substance and Sexual Activity   Alcohol use: Yes    Comment: gin and wine 2x/month   Drug use: No   Sexual activity: Yes  Other Topics Concern   Not on file  Social History Narrative   Not on file   Social Determinants of Health   Financial Resource Strain: Low Risk  (03/18/2022)   Overall Financial Resource Strain (CARDIA)    Difficulty of Paying Living Expenses: Not hard at all  Food Insecurity: No Food Insecurity (03/18/2022)   Hunger Vital Sign    Worried About Running Out of Food in the Last Year: Never true    Ran Out of Food in the Last Year: Never true  Transportation Needs: No Transportation Needs (03/18/2022)   PRAPARE - Hydrologist (Medical): No    Lack of Transportation (Non-Medical): No  Physical Activity: Insufficiently Active (03/18/2022)   Exercise Vital Sign    Days of Exercise per Week: 2 days    Minutes of Exercise per Session: 30 min  Stress: No Stress Concern Present (03/18/2022)   [place]    Feeling of Stress : Not at all  Social Connections: Moderately Isolated (03/18/2022)   Social Connection and Isolation Panel [NHANES]    Frequency of Communication with Friends and Family: Twice a week    Frequency of Social Gatherings with Friends and Family: More than three times a week    Attends Religious Services: Never    Marine scientist or Organizations: Yes    Attends Music therapist: 1 to 4 times per year    Marital Status: Divorced    Tobacco Counseling Counseling given: Not Answered   Clinical Intake:  Pre-visit preparation completed: Yes        BMI -  recorded: 32.56 Nutritional Status: BMI > 30  Obese Nutritional Risks: None Diabetes: No  How often do you need to have someone help you when you read instructions, pamphlets, or other written materials from your doctor or pharmacy?: 1 - Never  Diabetic?No  Interpreter Needed?: No  Information entered by :: Caroleen Hamman LPN   Activities of Daily Living    03/18/2022   11:37 AM  In your present state of health, do you have any difficulty performing the following activities:  Hearing? 0  Vision? 0  Difficulty concentrating or making decisions? 0  Walking or climbing stairs? 0  Dressing or bathing? 0  Doing errands, shopping? 0  Preparing Food and eating ? N  Using the Toilet? N  In the past six months, have you accidently leaked urine? N  Do you have problems with loss of bowel control? N  Managing your Medications? N  Managing your Finances? N  Housekeeping or managing your Housekeeping? N    Patient Care Team: Copland, Gay Filler, MD as PCP - General (Family Medicine)  Indicate any recent Medical Services you may have received from other than [facility] providers in the past year (date may be approximate).     Assessment:   This is a routine wellness examination for [facility].  Hearing/Vision screen Hearing Screening - Comments:: No issues Vision Screening - Comments:: Last eye exam-05/2021-  Dietary issues and exercise activities discussed: Current Exercise Habits: Home exercise routine, Type of exercise: walking, Time (Minutes): 30, Frequency (Times/Week): 2, Weekly Exercise (Minutes/Week): 60, Intensity: Mild, Exercise limited by: None identified   Goals Addressed             This Visit's Progress    Increase physical activity   Not on track      Depression Screen    03/18/2022   11:36 AM 03/28/2021   11:46 AM 02/08/2021    1:43 PM 09/28/2019    1:03 PM 10/31/2016    9:15 AM 03/24/2015    8:52 AM  PHQ 2/9 Scores  PHQ - 2 Score 0 5 1 0 0 0  PHQ- 9 Score  9         Fall Risk    03/18/2022   11:35 AM 03/28/2021   11:18 AM 02/08/2021    1:42 PM 09/28/2019    1:02 PM 04/01/2019    5:39 PM  [place] in the past year? 0 1 1 0 1  Comment     Emmi Telephone Survey: data to providers prior to load  Number falls in past yr: 0 1 1 0 1  Comment     Emmi Telephone Survey Actual Response = 2  Injury with Fall? 0 1 0 0 0  Risk for fall due to :  Impaired balance/gait;History of fall(s)     Follow up Falls prevention discussed Falls evaluation completed Falls prevention discussed Education provided;Falls prevention discussed     FALL RISK PREVENTION PERTAINING TO THE HOME:  Any stairs in or around the home? No  Home free of loose throw rugs in walkways, pet beds, electrical cords, etc? Yes  Adequate lighting in your home to reduce risk of falls? Yes   ASSISTIVE DEVICES UTILIZED TO PREVENT FALLS:  Life alert? No  Use of a cane, walker or w/c? No  Grab bars in the bathroom? Yes  Shower chair or bench in shower? Yes  Elevated toilet seat or a handicapped toilet? No   TIMED UP AND GO:  Was the test performed? No . Phone visit   Cognitive Function:Normal cognitive status assessed by  this Nurse Health Advisor. No abnormalities found.          02/08/2021    1:55 PM  6CIT Screen  What Year? 0 points  What month? 0 points  What time? 0 points  Count back from 20 0 points  Months in reverse 0 points  Repeat phrase 0 points  Total Score 0 points    Immunizations Immunization History  Administered Date(s) Administered   Fluad Quad(high Dose 65+) 05/31/2019, 08/09/2020, 06/12/2021   Influenza, High Dose Seasonal PF 09/25/2017, 07/08/2018   Influenza,inj,Quad PF,6+ Mos 08/10/2013, 08/16/2014  Moderna Covid-19 Vaccine Bivalent Booster 85yr & up 06/14/2021   Moderna SARS-COV2 Booster Vaccination 08/07/2020   Moderna Sars-Covid-2 Vaccination 10/14/2019, 11/12/2019   PFIZER(Purple Top)SARS-COV-2 Vaccination 01/17/2021   Pneumococcal  Conjugate-13 10/31/2016   Pneumococcal Polysaccharide-23 07/08/2018   Tdap 03/24/2015    TDAP status: Up to date  Flu Vaccine status: Up to date  Pneumococcal vaccine status: Up to date  Covid-19 vaccine status: Completed vaccines  Qualifies for Shingles Vaccine? Yes   Zostavax completed No   Shingrix Completed?: No.    Education has been provided regarding the importance of this vaccine. Patient has been advised to call insurance company to determine out of pocket expense if they have not yet received this vaccine. Advised may also receive vaccine at local pharmacy or Health Dept. Verbalized acceptance and understanding.  Screening Tests Health Maintenance  Topic Date Due   Zoster Vaccines- Shingrix (1 of 2) Never done   COVID-19 Vaccine (5 - Moderna series) 10/15/2021   INFLUENZA VACCINE  04/02/2022   MAMMOGRAM  05/18/2022   COLONOSCOPY (Pts 45-422yrInsurance coverage will need to be confirmed)  08/15/2023   TETANUS/TDAP  03/23/2025   Pneumonia Vaccine 6511Years old  Completed   DEXA SCAN  Completed   Hepatitis C Screening  Completed   HPV VACCINES  Aged Out    Health Maintenance  Health Maintenance Due  Topic Date Due   Zoster Vaccines- Shingrix (1 of 2) Never done   COVID-19 Vaccine (5 - Moderna series) 10/15/2021    Colorectal cancer screening: Type of screening: Colonoscopy. Completed 08/14/2018. Repeat every 5 years  Mammogram status: Ordered today. Pt provided with contact info and advised to call to schedule appt.   Bone Density status: Ordered today. Pt provided with contact info and advised to call to schedule appt.  Lung Cancer Screening: (Low Dose CT Chest recommended if Age 74-80ears, 30 pack-year currently smoking OR have quit w/in 15years.) does not qualify.     Additional Screening:  Hepatitis C Screening: Completed 07/26/2013  Vision Screening: Recommended annual ophthalmology exams for early detection of glaucoma and other disorders of the  eye. Is the patient up to date with their annual eye exam?  Yes  Who is the provider or what is the name of the office in which the patient attends annual eye exams? Dr. MiSabra HeckDental Screening: Recommended annual dental exams for proper oral hygiene  Community Resource Referral / Chronic Care Management: CRR required this visit?  No   CCM required this visit?  No      Plan:     I have personally reviewed and noted the following in the patient's chart:   Medical and social history Use of alcohol, tobacco or illicit drugs  Current medications and supplements including opioid prescriptions.  Functional ability and status Nutritional status Physical activity Advanced directives List of other physicians Hospitalizations, surgeries, and ER visits in previous 12 months Vitals Screenings to include cognitive, depression, and falls Referrals and appointments  In addition, I have reviewed and discussed with patient certain preventive protocols, quality metrics, and best practice recommendations. A written personalized care plan for preventive services as well as general preventive health recommendations were provided to patient.   Due to this being a telephonic visit, the after visit summary with patients personalized plan was offered to patient via mail or my-chart. Patient would like to access on my-chart.   MaMarta AntuLPN   03/11/23/2683Nurse Health Advisor  Nurse Notes: None

## 2022-03-18 NOTE — Patient Instructions (Signed)
[name] , Thank you for taking time to complete your Medicare Wellness Visit. I appreciate your ongoing commitment to your health goals. Please review the following plan we discussed and let me know if I can assist you in the future.   Screening recommendations/referrals: Colonoscopy: Completed 08/14/2018-Due 08/15/2023 Mammogram: Ordered today. Someone will call you to schedule. Bone Density: Ordered today. Someone will call you to schedule. Recommended yearly ophthalmology/optometry visit for glaucoma screening and checkup Recommended yearly dental visit for hygiene and checkup  Vaccinations: Influenza vaccine: Up to date Pneumococcal vaccine: Up to date Tdap vaccine: Up to date Shingles vaccine: completed first dose   Covid-19:Up to date  Advanced directives: May pick up packet at your next visit  Conditions/risks identified: See problem list  Next appointment: Follow up in one year for your annual wellness visit    Preventive Care 74 Years and Older, Female Preventive care refers to lifestyle choices and visits with your health care provider that can promote health and wellness. What does preventive care include? A yearly physical exam. This is also called an annual well check. Dental exams once or twice a year. Routine eye exams. Ask your health care provider how often you should have your eyes checked. Personal lifestyle choices, including: Daily care of your teeth and gums. Regular physical activity. Eating a healthy diet. Avoiding tobacco and drug use. Limiting alcohol use. Practicing safe sex. Taking low-dose aspirin every day. Taking vitamin and mineral supplements as recommended by your health care provider. What happens during an annual well check? The services and screenings done by your health care provider during your annual well check will depend on your age, overall health, lifestyle risk factors, and family history of disease. Counseling  Your health  care provider may ask you questions about your: Alcohol use. Tobacco use. Drug use. Emotional well-being. Home and relationship well-being. Sexual activity. Eating habits. History of falls. Memory and ability to understand (cognition). Work and work Statistician. Reproductive health. Screening  You may have the following tests or measurements: Height, weight, and BMI. Blood pressure. Lipid and cholesterol levels. These may be checked every 5 years, or more frequently if you are over 60 years old. Skin check. Lung cancer screening. You may have this screening every year starting at age 50 if you have a 30-pack-year history of smoking and currently smoke or have quit within the past 15 years. Fecal occult blood test (FOBT) of the stool. You may have this test every year starting at age 38. Flexible sigmoidoscopy or colonoscopy. You may have a sigmoidoscopy every 5 years or a colonoscopy every 10 years starting at age 16. Hepatitis C blood test. Hepatitis B blood test. Sexually transmitted disease (STD) testing. Diabetes screening. This is done by checking your blood sugar (glucose) after you have not eaten for a while (fasting). You may have this done every 1-3 years. Bone density scan. This is done to screen for osteoporosis. You may have this done starting at age 74. Mammogram. This may be done every 1-2 years. Talk to your health care provider about how often you should have regular mammograms. Talk with your health care provider about your test results, treatment options, and if necessary, the need for more tests. Vaccines  Your health care provider may recommend certain vaccines, such as: Influenza vaccine. This is recommended every year. Tetanus, diphtheria, and acellular pertussis (Tdap, Td) vaccine. You may need a Td booster every 10 years. Zoster vaccine. You may need this after age 40. Pneumococcal  13-valent conjugate (PCV13) vaccine. One dose is recommended after age  60. Pneumococcal polysaccharide (PPSV23) vaccine. One dose is recommended after age 38. Talk to your health care provider about which screenings and vaccines you need and how often you need them. This information is not intended to replace advice given to you by your health care provider. Make sure you discuss any questions you have with your health care provider. Document Released: 09/15/2015 Document Revised: 05/08/2016 Document Reviewed: 06/20/2015 Elsevier Interactive Patient Education  2017 [place] Prevention in the Home Falls can cause injuries. They can happen to people of all ages. There are many things you can do to make your home safe and to help prevent falls. What can I do on the outside of my home? Regularly fix the edges of walkways and driveways and fix any cracks. Remove anything that might make you trip as you walk through a door, such as a raised step or threshold. Trim any bushes or trees on the path to your home. Use bright outdoor lighting. Clear any walking paths of anything that might make someone trip, such as rocks or tools. Regularly check to see if handrails are loose or broken. Make sure that both sides of any steps have handrails. Any raised decks and porches should have guardrails on the edges. Have any leaves, snow, or ice cleared regularly. Use sand or salt on walking paths during winter. Clean up any spills in your garage right away. This includes oil or grease spills. What can I do in the bathroom? Use night lights. Install grab bars by the toilet and in the tub and shower. Do not use towel bars as grab bars. Use non-skid mats or decals in the tub or shower. If you need to sit down in the shower, use a plastic, non-slip stool. Keep the floor dry. Clean up any water that spills on the floor as soon as it happens. Remove soap buildup in the tub or shower regularly. Attach bath mats securely with double-sided non-slip rug tape. Do not have throw  rugs and other things on the floor that can make you trip. What can I do in the bedroom? Use night lights. Make sure that you have a light by your bed that is easy to reach. Do not use any sheets or blankets that are too big for your bed. They should not hang down onto the floor. Have a firm chair that has side arms. You can use this for support while you get dressed. Do not have throw rugs and other things on the floor that can make you trip. What can I do in the kitchen? Clean up any spills right away. Avoid walking on wet floors. Keep items that you use a lot in easy-to-reach places. If you need to reach something above you, use a strong step stool that has a grab bar. Keep electrical cords out of the way. Do not use floor polish or wax that makes floors slippery. If you must use wax, use non-skid floor wax. Do not have throw rugs and other things on the floor that can make you trip. What can I do with my stairs? Do not leave any items on the stairs. Make sure that there are handrails on both sides of the stairs and use them. Fix handrails that are broken or loose. Make sure that handrails are as long as the stairways. Check any carpeting to make sure that it is firmly attached to the stairs. Fix any carpet  that is loose or worn. Avoid having throw rugs at the top or bottom of the stairs. If you do have throw rugs, attach them to the floor with carpet tape. Make sure that you have a light switch at the top of the stairs and the bottom of the stairs. If you do not have them, ask someone to add them for you. What else can I do to help prevent falls? Wear shoes that: Do not have high heels. Have rubber bottoms. Are comfortable and fit you well. Are closed at the toe. Do not wear sandals. If you use a stepladder: Make sure that it is fully opened. Do not climb a closed stepladder. Make sure that both sides of the stepladder are locked into place. Ask someone to hold it for you, if  possible. Clearly mark and make sure that you can see: Any grab bars or handrails. First and last steps. Where the edge of each step is. Use tools that help you move around (mobility aids) if they are needed. These include: Canes. Walkers. Scooters. Crutches. Turn on the lights when you go into a dark area. Replace any light bulbs as soon as they burn out. Set up your furniture so you have a clear path. Avoid moving your furniture around. If any of your floors are uneven, fix them. If there are any pets around you, be aware of where they are. Review your medicines with your doctor. Some medicines can make you feel dizzy. This can increase your chance of falling. Ask your doctor what other things that you can do to help prevent falls. This information is not intended to replace advice given to you by your health care provider. Make sure you discuss any questions you have with your health care provider. Document Released: 06/15/2009 Document Revised: 01/25/2016 Document Reviewed: 09/23/2014 Elsevier Interactive Patient Education  2017 [employer].

## 2022-03-19 ENCOUNTER — Ambulatory Visit (HOSPITAL_BASED_OUTPATIENT_CLINIC_OR_DEPARTMENT_OTHER): Payer: Medicare Other

## 2022-03-26 ENCOUNTER — Encounter (HOSPITAL_BASED_OUTPATIENT_CLINIC_OR_DEPARTMENT_OTHER): Payer: Self-pay

## 2022-03-26 ENCOUNTER — Encounter: Payer: Self-pay | Admitting: Family Medicine

## 2022-03-26 ENCOUNTER — Ambulatory Visit (HOSPITAL_BASED_OUTPATIENT_CLINIC_OR_DEPARTMENT_OTHER)
Admission: RE | Admit: 2022-03-26 | Discharge: 2022-03-26 | Disposition: A | Discharge status: Home / Self Care | Payer: Medicare Other | Source: Ambulatory Visit | Attending: Family Medicine | Admitting: Family Medicine

## 2022-03-26 DIAGNOSIS — M810 Age-related osteoporosis without current pathological fracture: Secondary | ICD-10-CM | POA: Diagnosis not present

## 2022-03-26 DIAGNOSIS — Z1231 Encounter for screening mammogram for malignant neoplasm of breast: Secondary | ICD-10-CM | POA: Diagnosis not present

## 2022-03-26 DIAGNOSIS — Z780 Asymptomatic menopausal state: Secondary | ICD-10-CM | POA: Insufficient documentation

## 2022-05-01 ENCOUNTER — Other Ambulatory Visit: Payer: Self-pay | Admitting: Family Medicine

## 2022-06-19 ENCOUNTER — Ambulatory Visit (INDEPENDENT_AMBULATORY_CARE_PROVIDER_SITE_OTHER): Payer: Medicare Other

## 2022-06-19 DIAGNOSIS — Z23 Encounter for immunization: Secondary | ICD-10-CM | POA: Diagnosis not present

## 2022-07-01 ENCOUNTER — Other Ambulatory Visit (HOSPITAL_BASED_OUTPATIENT_CLINIC_OR_DEPARTMENT_OTHER): Payer: Self-pay

## 2022-07-03 ENCOUNTER — Other Ambulatory Visit: Payer: Self-pay | Admitting: Family Medicine

## 2022-07-03 DIAGNOSIS — Z23 Encounter for immunization: Secondary | ICD-10-CM | POA: Diagnosis not present

## 2022-07-05 ENCOUNTER — Other Ambulatory Visit: Payer: Self-pay | Admitting: Family Medicine

## 2022-07-05 DIAGNOSIS — I10 Essential (primary) hypertension: Secondary | ICD-10-CM

## 2022-08-04 ENCOUNTER — Other Ambulatory Visit: Payer: Self-pay | Admitting: Family Medicine

## 2022-08-10 NOTE — Progress Notes (Unsigned)
[facility] [address], [place], [place] [address] [phone]  Date:  08/14/2022   Name:  Rebecca Lopez   DOB:  1946-03-03   MRN:  [id number]  PCP:  Darreld Mclean, MD    Chief Complaint: No chief complaint on file.   History of Present Illness:  Rebecca Lopez is a 76 y.o. very pleasant female patient who presents with the following:  Pt seen today for recheck and med refills Last seen by myself 7/22-  History of hypertension, hyperlipidemia, vitamin D deficiency, osteoporosis Her osteoporosis is being treated with Prolia  At our last visit she was taking wellbutrin and propranolol and felt like she was doing overall ok   Shingrix Covid booster recommended  Most recent labs in July 2022  Losartan HCTZ Prolia  Wellbutrin   Patient Active Problem List   Diagnosis Date Noted   Osteoporosis 09/30/2017   Hyperlipidemia 10/04/2014   Essential hypertension 10/04/2014   Vitamin D deficiency 10/04/2014   Family history of malignant neoplasm of gastrointestinal tract 03/10/2013    Past Medical History:  Diagnosis Date   Allergy    Anemia    before hysterectomy    Arthritis    Cataract    beginnings    Chronic kidney disease    kidney stone   Depression    Duodenal ulcer    GERD (gastroesophageal reflux disease)    Hyperlipidemia    Hypertension    Neuromuscular disorder ([place])    neuropathy, CTS   Osteoporosis     Past Surgical History:  Procedure Laterality Date   ABDOMINAL HYSTERECTOMY     APPENDECTOMY     arm surgery     BARIATRIC SURGERY     COLONOSCOPY     CTR Bilateral 2005   had right hand done 1999   EYE SURGERY     POLYPECTOMY     TONSILLECTOMY      Social History   Tobacco Use   Smoking status: Never   Smokeless tobacco: Never  Substance Use Topics   Alcohol use: Yes    Comment: gin and wine 2x/month   Drug use: No    Family History  Problem Relation Age of  Onset   Hypertension Mother    Colon cancer Mother        pt states had no chemo, radiation    Colon polyps Mother    Heart disease Father    Cancer Sister    Colon cancer Sister 54       late 63's   Stroke Maternal Grandfather     Allergies  Allergen Reactions   Amlodipine     Possible small itchy spots on skin   Fosamax [Alendronate Sodium] Other (See Comments)    Achy after receiving     Medication list has been reviewed and updated.  Current Outpatient Medications on File Prior to Visit  Medication Sig Dispense Refill   buPROPion (WELLBUTRIN XL) 150 MG 24 hr tablet Take 2 tablets (300 mg total) by mouth daily. 60 tablet 0   cholecalciferol (VITAMIN D) 1000 units tablet Take 1,000 Units by mouth daily. (Patient not taking: Reported on 03/18/2022)     COVID-19 mRNA bivalent vaccine, Moderna, (MODERNA COVID-19 BIVAL BOOSTER) 50 MCG/0.5ML injection Inject into the muscle. (Patient not taking: Reported on 03/18/2022) 0.5 mL 0   denosumab (PROLIA) 60 MG/ML SOSY injection Inject 60 mg into the skin every 6 (  six) months.     ipratropium (ATROVENT) 0.03 % nasal spray PLACE 2 SPRAYS INTO BOTH NOSTRILS EVERY 12 HOURS 30 mL 12   losartan-hydrochlorothiazide (HYZAAR) 50-12.5 MG tablet TAKE 1/2 TABLET BY MOUTH DAILY 45 tablet 1   Multiple Vitamin (MULTIVITAMIN) tablet Take 1 tablet by mouth daily. (Patient not taking: Reported on 03/18/2022)     No current facility-administered medications on file prior to visit.    Review of Systems:  As per HPI- otherwise negative.   Physical Examination: There were no vitals filed for this visit. There were no vitals filed for this visit. There is no height or weight on file to calculate BMI. Ideal Body Weight:    GEN: no acute distress. HEENT: Atraumatic, Normocephalic.  Ears and Nose: No external deformity. CV: RRR, No M/G/R. No JVD. No thrill. No extra heart sounds. PULM: CTA B, no wheezes, crackles, rhonchi. No retractions. No resp.  distress. No accessory muscle use. ABD: S, NT, ND, +BS. No rebound. No HSM. EXTR: No c/c/e PSYCH: Normally interactive. Conversant.    Assessment and Plan: ***  Signed Lamar Blinks, MD

## 2022-08-10 NOTE — Patient Instructions (Incomplete)
Good to see you again today- I will be in touch with your labs asap  Please stop on the ground floor and schedule your CT coronary calcium test Let me know how you do with the fosamax- if bad side effects we can come up with a new plan for you!

## 2022-08-14 ENCOUNTER — Encounter: Payer: Self-pay | Admitting: Family Medicine

## 2022-08-14 ENCOUNTER — Telehealth: Payer: Self-pay

## 2022-08-14 ENCOUNTER — Ambulatory Visit (INDEPENDENT_AMBULATORY_CARE_PROVIDER_SITE_OTHER): Payer: Medicare Other | Admitting: Family Medicine

## 2022-08-14 VITALS — BP 126/84 | HR 78 | Temp 97.8°F | Resp 18 | Ht 62.0 in | Wt 187.8 lb

## 2022-08-14 DIAGNOSIS — I10 Essential (primary) hypertension: Secondary | ICD-10-CM

## 2022-08-14 DIAGNOSIS — Z1329 Encounter for screening for other suspected endocrine disorder: Secondary | ICD-10-CM | POA: Diagnosis not present

## 2022-08-14 DIAGNOSIS — E782 Mixed hyperlipidemia: Secondary | ICD-10-CM | POA: Diagnosis not present

## 2022-08-14 DIAGNOSIS — M810 Age-related osteoporosis without current pathological fracture: Secondary | ICD-10-CM | POA: Diagnosis not present

## 2022-08-14 DIAGNOSIS — F339 Major depressive disorder, recurrent, unspecified: Secondary | ICD-10-CM

## 2022-08-14 DIAGNOSIS — E559 Vitamin D deficiency, unspecified: Secondary | ICD-10-CM

## 2022-08-14 DIAGNOSIS — R7309 Other abnormal glucose: Secondary | ICD-10-CM

## 2022-08-14 LAB — CBC
HCT: 41.3 % (ref 36.0–46.0)
Hemoglobin: 14 g/dL (ref 12.0–15.0)
MCHC: 33.8 g/dL (ref 30.0–36.0)
MCV: 93 fl (ref 78.0–100.0)
Platelets: 222 10*3/uL (ref 150.0–400.0)
RBC: 4.44 Mil/uL (ref 3.87–5.11)
RDW: 14 % (ref 11.5–15.5)
WBC: 6.1 10*3/uL (ref 4.0–10.5)

## 2022-08-14 LAB — COMPREHENSIVE METABOLIC PANEL
ALT: 11 U/L (ref 0–35)
AST: 16 U/L (ref 0–37)
Albumin: 3.9 g/dL (ref 3.5–5.2)
Alkaline Phosphatase: 69 U/L (ref 39–117)
BUN: 16 mg/dL (ref 6–23)
CO2: 25 mEq/L (ref 19–32)
Calcium: 9.4 mg/dL (ref 8.4–10.5)
Chloride: 107 mEq/L (ref 96–112)
Creatinine, Ser: 0.9 mg/dL (ref 0.40–1.20)
GFR: 62.87 mL/min (ref >60.00)
Glucose, Bld: 86 mg/dL (ref 70–99)
Potassium: 3.8 mEq/L (ref 3.5–5.1)
Sodium: 141 mEq/L (ref 135–145)
Total Bilirubin: 0.4 mg/dL (ref 0.2–1.2)
Total Protein: 6.6 g/dL (ref 6.0–8.3)

## 2022-08-14 LAB — LIPID PANEL
Cholesterol: 238 mg/dL — ABNORMAL HIGH (ref 0–200)
HDL: 59.1 mg/dL (ref >39.00)
LDL Cholesterol: 154 mg/dL — ABNORMAL HIGH (ref 0–99)
NonHDL: 178.49
Total CHOL/HDL Ratio: 4
Triglycerides: 121 mg/dL (ref 0.0–149.0)
VLDL: 24.2 mg/dL (ref 0.0–40.0)

## 2022-08-14 LAB — TSH: TSH: 3.86 u[IU]/mL (ref 0.35–5.50)

## 2022-08-14 LAB — VITAMIN D 25 HYDROXY (VIT D DEFICIENCY, FRACTURES): VITD: 30.03 ng/mL (ref 30.00–100.00)

## 2022-08-14 LAB — HEMOGLOBIN A1C: Hgb A1c MFr Bld: 5.5 % (ref 4.6–6.5)

## 2022-08-14 MED ORDER — ALENDRONATE SODIUM 70 MG PO TABS: 70.0000 mg | ORAL_TABLET | ORAL | 3 refills | Status: DC

## 2022-08-14 MED ORDER — BUPROPION HCL ER (XL) 150 MG PO TB24: 150.0000 mg | ORAL_TABLET | Freq: Every day | ORAL | 3 refills | Status: DC

## 2022-08-14 MED ORDER — LOSARTAN POTASSIUM-HCTZ 50-12.5 MG PO TABS: 0.5000 | ORAL_TABLET | Freq: Every day | ORAL | 3 refills | Status: DC

## 2022-08-14 NOTE — Telephone Encounter (Signed)
Please disregard

## 2022-08-14 NOTE — Telephone Encounter (Signed)
Pts last Prolia injection was 09/06/21, she is due soo. Please check [employer].

## 2022-09-02 ENCOUNTER — Other Ambulatory Visit: Payer: Self-pay | Admitting: Family Medicine

## 2022-09-02 DIAGNOSIS — F339 Major depressive disorder, recurrent, unspecified: Secondary | ICD-10-CM

## 2022-09-23 ENCOUNTER — Ambulatory Visit (HOSPITAL_BASED_OUTPATIENT_CLINIC_OR_DEPARTMENT_OTHER): Payer: Medicare Other

## 2022-09-30 DIAGNOSIS — H35341 Macular cyst, hole, or pseudohole, right eye: Secondary | ICD-10-CM | POA: Diagnosis not present

## 2022-09-30 DIAGNOSIS — H35033 Hypertensive retinopathy, bilateral: Secondary | ICD-10-CM | POA: Diagnosis not present

## 2022-10-25 ENCOUNTER — Other Ambulatory Visit: Payer: Self-pay | Admitting: Family Medicine

## 2022-10-25 DIAGNOSIS — R0982 Postnasal drip: Secondary | ICD-10-CM

## 2022-10-30 ENCOUNTER — Other Ambulatory Visit: Payer: Self-pay | Admitting: Family Medicine

## 2022-10-30 DIAGNOSIS — F339 Major depressive disorder, recurrent, unspecified: Secondary | ICD-10-CM

## 2022-11-20 ENCOUNTER — Ambulatory Visit
Admission: EM | Admit: 2022-11-20 | Discharge: 2022-11-20 | Disposition: A | Discharge status: Home / Self Care | Payer: Medicare Other | Attending: Family Medicine | Admitting: Family Medicine

## 2022-11-20 ENCOUNTER — Ambulatory Visit (INDEPENDENT_AMBULATORY_CARE_PROVIDER_SITE_OTHER): Payer: Medicare Other

## 2022-11-20 DIAGNOSIS — M795 Residual foreign body in soft tissue: Secondary | ICD-10-CM

## 2022-11-20 DIAGNOSIS — M79642 Pain in left hand: Secondary | ICD-10-CM | POA: Diagnosis not present

## 2022-11-20 DIAGNOSIS — M19042 Primary osteoarthritis, left hand: Secondary | ICD-10-CM | POA: Diagnosis not present

## 2022-11-20 MED ORDER — TETANUS-DIPHTH-ACELL PERTUSSIS 5-2.5-18.5 LF-MCG/0.5 IM SUSY
0.5000 mL | PREFILLED_SYRINGE | Freq: Once | INTRAMUSCULAR | Status: AC
Start: 2022-11-20 — End: 2022-11-20
  Administered 2022-11-20: 0.5 mL via INTRAMUSCULAR
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
## 2022-11-20 NOTE — Discharge Instructions (Signed)
Meds ordered this encounter  Medications   Tdap (BOOSTRIX) injection 0.5 mL    
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
## 2022-11-25 ENCOUNTER — Telehealth: Payer: Self-pay | Admitting: Family Medicine
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
## 2022-11-25 NOTE — Telephone Encounter (Signed)
Called the pt back. She did go to [facility] on 11/20/22. I did let her know that JC was not in office this week, but if she needed to be seen that we could work her in. Pt says Alive gives her some relief and I advised that she try a body pillow. She says she will call back in a few days if she needs an appointment.

## 2023-02-17 ENCOUNTER — Encounter: Payer: Self-pay | Admitting: Family Medicine

## 2023-03-05 ENCOUNTER — Other Ambulatory Visit (HOSPITAL_BASED_OUTPATIENT_CLINIC_OR_DEPARTMENT_OTHER): Payer: Self-pay

## 2023-03-26 ENCOUNTER — Ambulatory Visit (INDEPENDENT_AMBULATORY_CARE_PROVIDER_SITE_OTHER): Payer: Medicare Other | Admitting: *Deleted

## 2023-03-26 VITALS — BP 142/66 | HR 69 | Ht 62.0 in | Wt 191.8 lb

## 2023-03-26 DIAGNOSIS — Z0000 Encounter for general adult medical examination without abnormal findings: Secondary | ICD-10-CM | POA: Diagnosis not present

## 2023-03-26 NOTE — Patient Instructions (Signed)
Rebecca Lopez , Thank you for taking time to come for your Medicare Wellness Visit. I appreciate your ongoing commitment to your health goals. Please review the following plan we discussed and let me know if I can assist you in the future.      This is a list of the screening recommended for you and due dates:  Health Maintenance  Topic Date Due   COVID-19 Vaccine (7 - 2023-24 season) 08/28/2022   Flu Shot  04/03/2023   Colon Cancer Screening  08/15/2023   Medicare Annual Wellness Visit  03/25/2024   DTaP/Tdap/Td vaccine (3 - Td or Tdap) 11/19/2032   Pneumonia Vaccine  Completed   DEXA scan (bone density measurement)  Completed   Hepatitis C Screening  Completed   Zoster (Shingles) Vaccine  Completed   HPV Vaccine  Aged Out    Next appointment: Follow up in one year for your annual wellness visit.   Preventive Care 10 Years and Older, Female Preventive care refers to lifestyle choices and visits with your health care provider that can promote health and wellness. What does preventive care include? A yearly physical exam. This is also called an annual well check. Dental exams once or twice a year. Routine eye exams. Ask your health care provider how often you should have your eyes checked. Personal lifestyle choices, including: Daily care of your teeth and gums. Regular physical activity. Eating a healthy diet. Avoiding tobacco and drug use. Limiting alcohol use. Practicing safe sex. Taking low-dose aspirin every day. Taking vitamin and mineral supplements as recommended by your health care provider. What happens during an annual well check? The services and screenings done by your health care provider during your annual well check will depend on your age, overall health, lifestyle risk factors, and family history of disease. Counseling  Your health care provider may ask you questions about your: Alcohol use. Tobacco use. Drug use. Emotional well-being. Home and  relationship well-being. Sexual activity. Eating habits. History of falls. Memory and ability to understand (cognition). Work and work Astronomer. Reproductive health. Screening  You may have the following tests or measurements: Height, weight, and BMI. Blood pressure. Lipid and cholesterol levels. These may be checked every 5 years, or more frequently if you are over 36 years old. Skin check. Lung cancer screening. You may have this screening every year starting at age 39 if you have a 30-pack-year history of smoking and currently smoke or have quit within the past 15 years. Fecal occult blood test (FOBT) of the stool. You may have this test every year starting at age 48. Flexible sigmoidoscopy or colonoscopy. You may have a sigmoidoscopy every 5 years or a colonoscopy every 10 years starting at age 53. Hepatitis C blood test. Hepatitis B blood test. Sexually transmitted disease (STD) testing. Diabetes screening. This is done by checking your blood sugar (glucose) after you have not eaten for a while (fasting). You may have this done every 1-3 years. Bone density scan. This is done to screen for osteoporosis. You may have this done starting at age 37. Mammogram. This may be done every 1-2 years. Talk to your health care provider about how often you should have regular mammograms. Talk with your health care provider about your test results, treatment options, and if necessary, the need for more tests. Vaccines  Your health care provider may recommend certain vaccines, such as: Influenza vaccine. This is recommended every year. Tetanus, diphtheria, and acellular pertussis (Tdap, Td) vaccine. You may need a  Td booster every 10 years. Zoster vaccine. You may need this after age 22. Pneumococcal 13-valent conjugate (PCV13) vaccine. One dose is recommended after age 24. Pneumococcal polysaccharide (PPSV23) vaccine. One dose is recommended after age 71. Talk to your health care provider  about which screenings and vaccines you need and how often you need them. This information is not intended to replace advice given to you by your health care provider. Make sure you discuss any questions you have with your health care provider. Document Released: 09/15/2015 Document Revised: 05/08/2016 Document Reviewed: 06/20/2015 Elsevier Interactive Patient Education  2017 [employer].  Fall Prevention in the Home Falls can cause injuries. They can happen to people of all ages. There are many things you can do to make your home safe and to help prevent falls. What can I do on the outside of my home? Regularly fix the edges of walkways and driveways and fix any cracks. Remove anything that might make you trip as you walk through a door, such as a raised step or threshold. Trim any bushes or trees on the path to your home. Use bright outdoor lighting. Clear any walking paths of anything that might make someone trip, such as rocks or tools. Regularly check to see if handrails are loose or broken. Make sure that both sides of any steps have handrails. Any raised decks and porches should have guardrails on the edges. Have any leaves, snow, or ice cleared regularly. Use sand or salt on walking paths during winter. Clean up any spills in your garage right away. This includes oil or grease spills. What can I do in the bathroom? Use night lights. Install grab bars by the toilet and in the tub and shower. Do not use towel bars as grab bars. Use non-skid mats or decals in the tub or shower. If you need to sit down in the shower, use a plastic, non-slip stool. Keep the floor dry. Clean up any water that spills on the floor as soon as it happens. Remove soap buildup in the tub or shower regularly. Attach bath mats securely with double-sided non-slip rug tape. Do not have throw rugs and other things on the floor that can make you trip. What can I do in the bedroom? Use night lights. Make sure  that you have a light by your bed that is easy to reach. Do not use any sheets or blankets that are too big for your bed. They should not hang down onto the floor. Have a firm chair that has side arms. You can use this for support while you get dressed. Do not have throw rugs and other things on the floor that can make you trip. What can I do in the kitchen? Clean up any spills right away. Avoid walking on wet floors. Keep items that you use a lot in easy-to-reach places. If you need to reach something above you, use a strong step stool that has a grab bar. Keep electrical cords out of the way. Do not use floor polish or wax that makes floors slippery. If you must use wax, use non-skid floor wax. Do not have throw rugs and other things on the floor that can make you trip. What can I do with my stairs? Do not leave any items on the stairs. Make sure that there are handrails on both sides of the stairs and use them. Fix handrails that are broken or loose. Make sure that handrails are as long as the stairways. Check any  carpeting to make sure that it is firmly attached to the stairs. Fix any carpet that is loose or worn. Avoid having throw rugs at the top or bottom of the stairs. If you do have throw rugs, attach them to the floor with carpet tape. Make sure that you have a light switch at the top of the stairs and the bottom of the stairs. If you do not have them, ask someone to add them for you. What else can I do to help prevent falls? Wear shoes that: Do not have high heels. Have rubber bottoms. Are comfortable and fit you well. Are closed at the toe. Do not wear sandals. If you use a stepladder: Make sure that it is fully opened. Do not climb a closed stepladder. Make sure that both sides of the stepladder are locked into place. Ask someone to hold it for you, if possible. Clearly mark and make sure that you can see: Any grab bars or handrails. First and last steps. Where the edge of  each step is. Use tools that help you move around (mobility aids) if they are needed. These include: Canes. Walkers. Scooters. Crutches. Turn on the lights when you go into a dark area. Replace any light bulbs as soon as they burn out. Set up your furniture so you have a clear path. Avoid moving your furniture around. If any of your floors are uneven, fix them. If there are any pets around you, be aware of where they are. Review your medicines with your doctor. Some medicines can make you feel dizzy. This can increase your chance of falling. Ask your doctor what other things that you can do to help prevent falls. This information is not intended to replace advice given to you by your health care provider. Make sure you discuss any questions you have with your health care provider. Document Released: 06/15/2009 Document Revised: 01/25/2016 Document Reviewed: 09/23/2014 Elsevier Interactive Patient Education  2017 [employer].

## 2023-03-26 NOTE — Progress Notes (Signed)
Subjective:   Rebecca Lopez is a 77 y.o. female who presents for Medicare Annual (Subsequent) preventive examination.  Visit Complete: In person   Review of Systems     Cardiac Risk Factors include: advanced age (>72men, >18 women);dyslipidemia;hypertension;obesity (BMI >30kg/m2)     Objective:    Today's Vitals   03/26/23 0842 03/26/23 0901  BP: (!) 154/85 (!) 142/66  Pulse: 70 69  Weight: 191 lb 12.8 oz (87 kg)   Height: 5\' 2"  (1.575 m)    Body mass index is 35.08 kg/m.     03/26/2023    8:58 AM 03/18/2022   11:32 AM 02/08/2021    1:39 PM 09/28/2019   12:56 PM 05/24/2018    7:58 PM 10/31/2016    9:13 AM  Advanced Directives  Does Patient Have a Medical Advance Directive? No No No No No No  Does patient want to make changes to medical advance directive?   Yes (MAU/Ambulatory/Procedural Areas - Information given)     Would patient like information on creating a medical advance directive? No - Patient declined   No - Patient declined No - Patient declined Yes (MAU/Ambulatory/Procedural Areas - Information given)    Current Medications (verified) Outpatient Encounter Medications as of 03/26/2023  Medication Sig   fexofenadine (ALLEGRA) 60 MG tablet Take 60 mg by mouth 2 (two) times daily.   alendronate (FOSAMAX) 70 MG tablet Take 1 tablet (70 mg total) by mouth every 7 (seven) days. Take with a full glass of water on an empty stomach.   buPROPion (WELLBUTRIN XL) 150 MG 24 hr tablet TAKE 2 TABLETS BY MOUTH DAILY   cholecalciferol (VITAMIN D) 1000 units tablet Take 1,000 Units by mouth daily.   ipratropium (ATROVENT) 0.03 % nasal spray PLACE 2 SPRAYS IN BOTH NOSTRILS EVERY 12 HOURS   losartan-hydrochlorothiazide (HYZAAR) 50-12.5 MG tablet Take 0.5 tablets by mouth daily.   Multiple Vitamin (MULTIVITAMIN) tablet Take 1 tablet by mouth daily.   No facility-administered encounter medications on file as of 03/26/2023.    Allergies (verified) Amlodipine and Fosamax [alendronate  sodium]   History: Past Medical History:  Diagnosis Date   Allergy    Anemia    before hysterectomy    Arthritis    Cataract    beginnings    Chronic kidney disease    kidney stone   Depression    Duodenal ulcer    GERD (gastroesophageal reflux disease)    Hyperlipidemia    Hypertension    Neuromuscular disorder (HCC)    neuropathy, CTS   Osteoporosis    Past Surgical History:  Procedure Laterality Date   ABDOMINAL HYSTERECTOMY     APPENDECTOMY     arm surgery     BARIATRIC SURGERY     COLONOSCOPY     CTR Bilateral 2005   had right hand done 1999   EYE SURGERY     POLYPECTOMY     TONSILLECTOMY     Family History  Problem Relation Age of Onset   Hypertension Mother    Colon cancer Mother        pt states had no chemo, radiation    Colon polyps Mother    Heart disease Father    Cancer Sister    Colon cancer Sister 57       late 40's   Stroke Maternal Grandfather    Social History   Socioeconomic History   Marital status: Divorced    Spouse name: Not on file   Number  of children: Not on file   Years of education: Not on file   Highest education level: Not on file  Occupational History   Not on file  Tobacco Use   Smoking status: Never   Smokeless tobacco: Never  Substance and Sexual Activity   Alcohol use: Yes    Comment: gin and wine 2x/month   Drug use: No   Sexual activity: Yes  Other Topics Concern   Not on file  Social History Narrative   Not on file   Social Determinants of Health   Financial Resource Strain: Low Risk  (03/26/2023)   Overall Financial Resource Strain (CARDIA)    Difficulty of Paying Living Expenses: Not hard at all  Food Insecurity: No Food Insecurity (03/26/2023)   Hunger Vital Sign    Worried About Running Out of Food in the Last Year: Never true    Ran Out of Food in the Last Year: Never true  Transportation Needs: No Transportation Needs (03/26/2023)   PRAPARE - Administrator, Civil Service (Medical): No     Lack of Transportation (Non-Medical): No  Physical Activity: Inactive (03/26/2023)   Exercise Vital Sign    Days of Exercise per Week: 0 days    Minutes of Exercise per Session: 0 min  Stress: No Stress Concern Present (03/26/2023)   [name] of Occupational Health - Occupational Stress Questionnaire    Feeling of Stress : Not at all  Social Connections: Socially Isolated (03/26/2023)   Social Connection and Isolation Panel [NHANES]    Frequency of Communication with Friends and Family: More than three times a week    Frequency of Social Gatherings with Friends and Family: More than three times a week    Attends Religious Services: Never    Database administrator or Organizations: No    Attends Engineer, structural: Never    Marital Status: Divorced    Tobacco Counseling Counseling given: Not Answered   Clinical Intake:  Pre-visit preparation completed: Yes  Pain : No/denies pain  BMI - recorded: 35.08 Nutritional Status: BMI > 30  Obese Nutritional Risks: None Diabetes: No  How often do you need to have someone help you when you read instructions, pamphlets, or other written materials from your doctor or pharmacy?: 1 - Never  Interpreter Needed?: No  Information entered by :: [name], CMA   Activities of Daily Living    03/26/2023    8:49 AM  In your present state of health, do you have any difficulty performing the following activities:  Hearing? 1  Comment slight hearing loss  Vision? 0  Difficulty concentrating or making decisions? 0  Walking or climbing stairs? 1  Dressing or bathing? 0  Doing errands, shopping? 0  Preparing Food and eating ? N  Using the Toilet? N  In the past six months, have you accidently leaked urine? Y  Do you have problems with loss of bowel control? N  Managing your Medications? N  Managing your Finances? N  Housekeeping or managing your Housekeeping? N    Patient Care Team: Copland, Gwenlyn Found, MD as  PCP - General (Family Medicine)  Indicate any recent Medical Services you may have received from other than [facility] providers in the past year (date may be approximate).     Assessment:   This is a routine wellness examination for [name].  Hearing/Vision screen No results found.  Dietary issues and exercise activities discussed:     Goals Addressed  None    Depression Screen    03/26/2023    8:57 AM 08/14/2022   11:22 AM 08/14/2022   11:08 AM 03/18/2022   11:36 AM 03/28/2021   11:46 AM 02/08/2021    1:43 PM 09/28/2019    1:03 PM  PHQ 2/9 Scores  PHQ - 2 Score 0 0 0 0 5 1 0  PHQ- 9 Score  0   9      Fall Risk    03/26/2023    8:52 AM 08/14/2022   11:08 AM 03/18/2022   11:35 AM 03/28/2021   11:18 AM 02/08/2021    1:42 PM  Fall Risk   Falls in the past year? 1 0 0 1 1  Comment fell in the sand at the beach walking [place] the hill      Number falls in past yr: 0 0 0 1 1  Injury with Fall? 0 0 0 1 0  Risk for fall due to : No Fall Risks No Fall Risks  Impaired balance/gait;History of fall(s)   Follow up Falls evaluation completed Falls evaluation completed Falls prevention discussed Falls evaluation completed Falls prevention discussed    MEDICARE RISK AT HOME:  Medicare Risk at Home - 03/26/23 0851     Any stairs in or around the home? Yes    If so, are there any without handrails? No    Home free of loose throw rugs in walkways, pet beds, electrical cords, etc? Yes    Adequate lighting in your home to reduce risk of falls? Yes    Life alert? No    Use of a cane, walker or w/c? No    Grab bars in the bathroom? Yes    Shower chair or bench in shower? No    Elevated toilet seat or a handicapped toilet? Yes   comfort height            TIMED UP AND GO:  Was the test performed?  Yes  Length of time to ambulate 10 feet: 6 sec Gait steady and fast without use of assistive device    Cognitive Function:        03/26/2023    9:00 AM 02/08/2021    1:55 PM  6CIT Screen   What Year? 0 points 0 points  What month? 0 points 0 points  What time? 0 points 0 points  Count back from 20 0 points 0 points  Months in reverse 0 points 0 points  Repeat phrase 0 points 0 points  Total Score 0 points 0 points    Immunizations Immunization History  Administered Date(s) Administered   Fluad Quad(high Dose 65+) 05/31/2019, 08/09/2020, 06/12/2021, 06/19/2022   Influenza, High Dose Seasonal PF 09/25/2017, 07/08/2018   Influenza,inj,Quad PF,6+ Mos 08/10/2013, 08/16/2014   Moderna Covid-19 Vaccine Bivalent Booster 60yrs & up 06/14/2021   Moderna SARS-COV2 Booster Vaccination 08/07/2020   Moderna Sars-Covid-2 Vaccination 10/14/2019, 11/12/2019, 07/03/2022   PFIZER(Purple Top)SARS-COV-2 Vaccination 01/17/2021   Pneumococcal Conjugate-13 10/31/2016   Pneumococcal Polysaccharide-23 07/08/2018   Tdap 03/24/2015, 11/20/2022   Zoster Recombinant(Shingrix) 12/26/2021, 06/27/2022    TDAP status: Up to date  Flu Vaccine status: Up to date  Pneumococcal vaccine status: Up to date  Covid-19 vaccine status: Information provided on how to obtain vaccines.   Qualifies for Shingles Vaccine? Yes   Zostavax completed No   Shingrix Completed?: Yes  Screening Tests Health Maintenance  Topic Date Due   COVID-19 Vaccine (7 - 2023-24 season) 08/28/2022   Medicare Annual  Wellness (AWV)  03/19/2023   INFLUENZA VACCINE  04/03/2023   Colonoscopy  08/15/2023   DTaP/Tdap/Td (3 - Td or Tdap) 11/19/2032   Pneumonia Vaccine 78+ Years old  Completed   DEXA SCAN  Completed   Hepatitis C Screening  Completed   Zoster Vaccines- Shingrix  Completed   HPV VACCINES  Aged Out    Health Maintenance  Health Maintenance Due  Topic Date Due   COVID-19 Vaccine (7 - 2023-24 season) 08/28/2022   Medicare Annual Wellness (AWV)  03/19/2023    Colorectal cancer screening: Type of screening: Colonoscopy. Completed 08/14/18. Repeat every 5 years  Mammogram status: Completed 03/26/22. Repeat  every year  Bone Density status: Completed 03/26/22. Results reflect: Bone density results: OSTEOPOROSIS. Repeat every 2 years.  Lung Cancer Screening: (Low Dose CT Chest recommended if Age 60-80 years, 20 pack-year currently smoking OR have quit w/in 15years.) does not qualify.   Additional Screening:  Hepatitis C Screening: does qualify; Completed 07/26/13  Vision Screening: Recommended annual ophthalmology exams for early detection of glaucoma and other disorders of the eye. Is the patient up to date with their annual eye exam?  Yes  Who is the provider or what is the name of the office in which the patient attends annual eye exams? Dr. Jimmye Norman If pt is not established with a provider, would they like to be referred to a provider to establish care? No .   Dental Screening: Recommended annual dental exams for proper oral hygiene  Diabetic Foot Exam: N/a  Community Resource Referral / Chronic Care Management: CRR required this visit?  No   CCM required this visit?  No     Plan:     I have personally reviewed and noted the following in the patient's chart:   Medical and social history Use of alcohol, tobacco or illicit drugs  Current medications and supplements including opioid prescriptions. Patient is not currently taking opioid prescriptions. Functional ability and status Nutritional status Physical activity Advanced directives List of other physicians Hospitalizations, surgeries, and ER visits in previous 12 months Vitals Screenings to include cognitive, depression, and falls Referrals and appointments  In addition, I have reviewed and discussed with patient certain preventive protocols, quality metrics, and best practice recommendations. A written personalized care plan for preventive services as well as general preventive health recommendations were provided to patient.     Donne Anon, CMA   03/26/2023   After Visit Summary: Sent to mychart  Nurse  Notes: None

## 2023-05-27 NOTE — Patient Instructions (Incomplete)
It was great to see you again today- I am sorry you are under so much stress!  You might check out "[email or web address]" as a possible resource for a counselor, or [facility] Phone: [phone]  Lets try adding trazodone about 30 minutes before bedtime-I gave you 50 mg, start with 1/2 tablet and go up as needed.  Let me know how this seems to work for you  I will get you set up with sports medicine to help you with a foot injection as indicated.  In the meantime, I would suggest replacing your athletic shoes and possibly add some cushioned heel cups for additional padding  Ordered mammogram for you today, I will be in touch with your labs as well

## 2023-05-27 NOTE — Progress Notes (Unsigned)
[facility] [address], [address] [place], [place] [address] [phone]  Date:  05/28/2023   Name:  Rebecca Lopez   DOB:  [birth year]   MRN:  [id number]  PCP:  Pearline Cables, MD    Chief Complaint: plantar facitis (Pt says her L heal has been bothering for about 4 weeks now. But on and off for over 40 years. /Flu shot today: yes/)   History of Present Illness:  Rebecca Lopez is a 77 y.o. very pleasant female patient who presents with the following:  Patient seen today to discuss foot pain-possible plantar fasciitis. Most recent visit with myself was in December of last year History of hypertension, hyperlipidemia, vitamin D deficiency, osteoporosis  We switched her from Prolia back to Fosamax last year due to cost  She had been taking care of her sister in her home- since then she moved to assisted living and did go on hospice.  Rebecca Lopez notes she is still stressed about her sister even though she is no longer in her home- she still has a lot of needs.  They do not plan for her sister to move back in with her-however, Rebecca Lopez admits she does sometimes stay at night worrying about her sister  She is also taking losartan/HCTZ, Wellbutrin She is trying to find a counselor - she thinks she found someone Depression sx are still bothersome- she is using wellbutrin but as above notes she tends to have some worries and anxiety  Her left foot has bothered her off and on for years but worse the last 2 months Her pain is much worse if she is barefoot More pain when she first gets up in the am but it does not tend to improve as she walks around She is taking some advil She is wearing supportive athletic shoes all the time  No injury that she can recall  No change in activity level   Flu shot-give today COVID booster-recommended Colon cancer screening is coming due soon Mammogram completed July of last year DEXA scan also July  2023 Patient Active Problem List   Diagnosis Date Noted   Osteoporosis 09/30/2017   Hyperlipidemia 10/04/2014   Essential hypertension 10/04/2014   Vitamin D deficiency 10/04/2014   Family history of malignant neoplasm of gastrointestinal tract 03/10/2013    Past Medical History:  Diagnosis Date   Allergy    Anemia    before hysterectomy    Arthritis    Cataract    beginnings    Chronic kidney disease    kidney stone   Depression    Duodenal ulcer    GERD (gastroesophageal reflux disease)    Hyperlipidemia    Hypertension    Neuromuscular disorder (HCC)    neuropathy, CTS   Osteoporosis     Past Surgical History:  Procedure Laterality Date   ABDOMINAL HYSTERECTOMY     APPENDECTOMY     arm surgery     BARIATRIC SURGERY     COLONOSCOPY     CTR Bilateral 2005   had right hand done 1999   EYE SURGERY     POLYPECTOMY     TONSILLECTOMY      Social History   Tobacco Use   Smoking status: Never   Smokeless tobacco: Never  Substance Use Topics   Alcohol use: Yes    Comment: gin and wine 2x/month   Drug use: No    Family History  Problem Relation Age of Onset   Hypertension Mother    Colon cancer Mother        pt states had no chemo, radiation    Colon polyps Mother    Heart disease Father    Cancer Sister    Colon cancer Sister 30       late 81's   Stroke Maternal Grandfather     Allergies  Allergen Reactions   Amlodipine     Possible small itchy spots on skin   Fosamax [Alendronate Sodium] Other (See Comments)    Achy after receiving     Medication list has been reviewed and updated.  Current Outpatient Medications on File Prior to Visit  Medication Sig Dispense Refill   alendronate (FOSAMAX) 70 MG tablet Take 1 tablet (70 mg total) by mouth every 7 (seven) days. Take with a full glass of water on an empty stomach. 12 tablet 3   buPROPion (WELLBUTRIN XL) 150 MG 24 hr tablet TAKE 2 TABLETS BY MOUTH DAILY 180 tablet 1   cholecalciferol (VITAMIN  D) 1000 units tablet Take 1,000 Units by mouth daily.     fexofenadine (ALLEGRA) 60 MG tablet Take 60 mg by mouth 2 (two) times daily.     ipratropium (ATROVENT) 0.03 % nasal spray PLACE 2 SPRAYS IN BOTH NOSTRILS EVERY 12 HOURS 30 mL 12   losartan-hydrochlorothiazide (HYZAAR) 50-12.5 MG tablet Take 0.5 tablets by mouth daily. 45 tablet 3   Multiple Vitamin (MULTIVITAMIN) tablet Take 1 tablet by mouth daily.     No current facility-administered medications on file prior to visit.    Review of Systems:  As per HPI- otherwise negative.   Physical Examination: Vitals:   05/28/23 0829  BP: 132/80  Pulse: 72  Resp: 18  Temp: 97.8 F (36.6 C)  SpO2: 98%   Vitals:   05/28/23 0829  Weight: 186 lb 9.6 oz (84.6 kg)  Height: 5\' 2"  (1.575 m)   Body mass index is 34.13 kg/m. Ideal Body Weight: Weight in (lb) to have BMI = 25: 136.4  GEN: no acute distress. HEENT: Atraumatic, Normocephalic.  Ears and Nose: No external deformity. CV: RRR, No M/G/R. No JVD. No thrill. No extra heart sounds. PULM: CTA B, no wheezes, crackles, rhonchi. No retractions. No resp. distress. No accessory muscle use. ABD: S, NT, ND, +BS. No rebound. No HSM. EXTR: No c/c/e PSYCH: Normally interactive. Conversant.  Left foot: She is tender over the inferior aspect of her heel, foot is swollen/puffy on the dorsal aspect but patient states this is typical for her  Assessment and Plan: Essential hypertension - Plan: CBC, Comprehensive metabolic panel, TSH, losartan-hydrochlorothiazide (HYZAAR) 50-12.5 MG tablet  Mixed hyperlipidemia - Plan: Lipid panel  Immunization due - Plan: Flu Vaccine Trivalent High Dose (Fluad)  Adjustment insomnia - Plan: traZODone (DESYREL) 50 MG tablet  Screening mammogram for breast cancer - Plan: MM 3D SCREENING MAMMOGRAM BILATERAL BREAST  Age-related osteoporosis without current pathological fracture - Plan: alendronate (FOSAMAX) 70 MG tablet  Vitamin D deficiency - Plan:  VITAMIN D 25 Hydroxy (Vit-D Deficiency, Fractures)  Screening for diabetes mellitus - Plan: Comprehensive metabolic panel, Hemoglobin A1c  Patient seen today for follow-up Blood pressure well-controlled on current regimen, labs pending as above Follow-up on lipids-she is not currently on statin We will try adding trazodone for insomnia, asked her let me know how this works for her Doing well with Fosamax, refilled Ordered mammogram Signed Abbe Amsterdam, MD  Results for orders placed or performed in  visit on 05/28/23  CBC  Result Value Ref Range   WBC 7.5 4.0 - 10.5 K/uL   RBC 4.60 3.87 - 5.11 Mil/uL   Platelets 254.0 150.0 - 400.0 K/uL   Hemoglobin 14.2 12.0 - 15.0 g/dL   HCT 04.5 40.9 - 81.1 %   MCV 94.6 78.0 - 100.0 fl   MCHC 32.7 30.0 - 36.0 g/dL   RDW 91.4 78.2 - 95.6 %  Comprehensive metabolic panel  Result Value Ref Range   Sodium 139 135 - 145 mEq/L   Potassium 4.5 3.5 - 5.1 mEq/L   Chloride 105 96 - 112 mEq/L   CO2 27 19 - 32 mEq/L   Glucose, Bld 88 70 - 99 mg/dL   BUN 18 6 - 23 mg/dL   Creatinine, Ser 2.13 0.40 - 1.20 mg/dL   Total Bilirubin 0.5 0.2 - 1.2 mg/dL   Alkaline Phosphatase 80 39 - 117 U/L   AST 16 0 - 37 U/L   ALT 14 0 - 35 U/L   Total Protein 6.7 6.0 - 8.3 g/dL   Albumin 3.9 3.5 - 5.2 g/dL   GFR 08.65 (L) >78.46 mL/min   Calcium 9.8 8.4 - 10.5 mg/dL  Hemoglobin N6E  Result Value Ref Range   Hgb A1c MFr Bld 5.4 4.6 - 6.5 %  Lipid panel  Result Value Ref Range   Cholesterol 235 (H) 0 - 200 mg/dL   Triglycerides 952.8 (H) 0.0 - 149.0 mg/dL   HDL 41.32 >44.01 mg/dL   VLDL 02.7 0.0 - 25.3 mg/dL   LDL Cholesterol 664 (H) 0 - 99 mg/dL   Total CHOL/HDL Ratio 4    NonHDL 177.46   TSH  Result Value Ref Range   TSH 5.58 (H) 0.35 - 5.50 uIU/mL  VITAMIN D 25 Hydroxy (Vit-D Deficiency, Fractures)  Result Value Ref Range   VITD 33.27 30.00 - 100.00 ng/mL

## 2023-05-28 ENCOUNTER — Encounter: Payer: Self-pay | Admitting: Family Medicine

## 2023-05-28 ENCOUNTER — Ambulatory Visit: Payer: Medicare Other | Admitting: Sports Medicine

## 2023-05-28 ENCOUNTER — Encounter: Payer: Self-pay | Admitting: Sports Medicine

## 2023-05-28 ENCOUNTER — Ambulatory Visit: Payer: Medicare Other | Admitting: Family Medicine

## 2023-05-28 VITALS — BP 132/80 | HR 72 | Temp 97.8°F | Resp 18 | Ht 62.0 in | Wt 186.6 lb

## 2023-05-28 VITALS — BP 136/88 | Ht 62.0 in | Wt 186.0 lb

## 2023-05-28 DIAGNOSIS — F5102 Adjustment insomnia: Secondary | ICD-10-CM

## 2023-05-28 DIAGNOSIS — E559 Vitamin D deficiency, unspecified: Secondary | ICD-10-CM

## 2023-05-28 DIAGNOSIS — Z23 Encounter for immunization: Secondary | ICD-10-CM | POA: Diagnosis not present

## 2023-05-28 DIAGNOSIS — E782 Mixed hyperlipidemia: Secondary | ICD-10-CM | POA: Diagnosis not present

## 2023-05-28 DIAGNOSIS — Z1231 Encounter for screening mammogram for malignant neoplasm of breast: Secondary | ICD-10-CM

## 2023-05-28 DIAGNOSIS — I10 Essential (primary) hypertension: Secondary | ICD-10-CM | POA: Diagnosis not present

## 2023-05-28 DIAGNOSIS — M810 Age-related osteoporosis without current pathological fracture: Secondary | ICD-10-CM

## 2023-05-28 DIAGNOSIS — M722 Plantar fascial fibromatosis: Secondary | ICD-10-CM | POA: Diagnosis not present

## 2023-05-28 DIAGNOSIS — M79672 Pain in left foot: Secondary | ICD-10-CM

## 2023-05-28 DIAGNOSIS — Z131 Encounter for screening for diabetes mellitus: Secondary | ICD-10-CM

## 2023-05-28 DIAGNOSIS — R7989 Other specified abnormal findings of blood chemistry: Secondary | ICD-10-CM

## 2023-05-28 LAB — CBC
HCT: 43.6 % (ref 36.0–46.0)
Hemoglobin: 14.2 g/dL (ref 12.0–15.0)
MCHC: 32.7 g/dL (ref 30.0–36.0)
MCV: 94.6 fl (ref 78.0–100.0)
Platelets: 254 10*3/uL (ref 150.0–400.0)
RBC: 4.6 Mil/uL (ref 3.87–5.11)
RDW: 14.3 % (ref 11.5–15.5)
WBC: 7.5 10*3/uL (ref 4.0–10.5)

## 2023-05-28 LAB — COMPREHENSIVE METABOLIC PANEL
ALT: 14 U/L (ref 0–35)
AST: 16 U/L (ref 0–37)
Albumin: 3.9 g/dL (ref 3.5–5.2)
Alkaline Phosphatase: 80 U/L (ref 39–117)
BUN: 18 mg/dL (ref 6–23)
CO2: 27 mEq/L (ref 19–32)
Calcium: 9.8 mg/dL (ref 8.4–10.5)
Chloride: 105 mEq/L (ref 96–112)
Creatinine, Ser: 0.98 mg/dL (ref 0.40–1.20)
GFR: 56.45 mL/min — ABNORMAL LOW (ref >60.00)
Glucose, Bld: 88 mg/dL (ref 70–99)
Potassium: 4.5 mEq/L (ref 3.5–5.1)
Sodium: 139 mEq/L (ref 135–145)
Total Bilirubin: 0.5 mg/dL (ref 0.2–1.2)
Total Protein: 6.7 g/dL (ref 6.0–8.3)

## 2023-05-28 LAB — LIPID PANEL
Cholesterol: 235 mg/dL — ABNORMAL HIGH (ref 0–200)
HDL: 57.7 mg/dL (ref >39.00)
LDL Cholesterol: 146 mg/dL — ABNORMAL HIGH (ref 0–99)
NonHDL: 177.46
Total CHOL/HDL Ratio: 4
Triglycerides: 155 mg/dL — ABNORMAL HIGH (ref 0.0–149.0)
VLDL: 31 mg/dL (ref 0.0–40.0)

## 2023-05-28 LAB — VITAMIN D 25 HYDROXY (VIT D DEFICIENCY, FRACTURES): VITD: 33.27 ng/mL (ref 30.00–100.00)

## 2023-05-28 LAB — TSH: TSH: 5.58 u[IU]/mL — ABNORMAL HIGH (ref 0.35–5.50)

## 2023-05-28 LAB — HEMOGLOBIN A1C: Hgb A1c MFr Bld: 5.4 % (ref 4.6–6.5)

## 2023-05-28 MED ORDER — LOSARTAN POTASSIUM-HCTZ 50-12.5 MG PO TABS: 0.5000 | ORAL_TABLET | Freq: Every day | ORAL | 3 refills | Status: DC

## 2023-05-28 MED ORDER — TRAZODONE HCL 50 MG PO TABS: 25.0000 mg | ORAL_TABLET | Freq: Every evening | ORAL | 3 refills | Status: DC | PRN

## 2023-05-28 MED ORDER — ALENDRONATE SODIUM 70 MG PO TABS: 70.0000 mg | ORAL_TABLET | ORAL | 3 refills | Status: DC

## 2023-05-28 NOTE — Progress Notes (Signed)
Subjective:    Patient ID: Rebecca Lopez, female    DOB: 1946/06/12, 77 y.o.   MRN: [id number]  HPI chief complaint: Left heel pain  Patient is a very pleasant 77 year old female that presents today with approximately 3 weeks of left heel pain.  She has had Planter fasciitis in the past.  Pain feels similar to that.  She has had good success with cortisone injections in the past.  She localizes the pain to the plantar aspect of her heel.  It is worse with first step as well as with weightbearing.  She is starting to limp.  She saw her primary care physician earlier who recommended that she purchase some heel cups.  No recent imaging has been done.  Past medical history reviewed Medications reviewed Allergies reviewed    Review of Systems As above    Objective:   Physical Exam  Well-developed, well-nourished.  No acute distress  Left heel: There is tenderness to palpation at the calcaneal origin of the plantar fascia.  No soft tissue swelling.  Good pulses.  Limited MSK ultrasound shows thickening of the plantar fascia at 0.54       Assessment & Plan:   Left heel plantar fasciitis  I recommended that we start with home exercises consisting of plantar fascial stretching and calf stretching.  I agree with purchasing gel cups for her shoes.  I also recommended daily ice baths for the right heel.  She may also consider a [place] sock at night.  We discussed shockwave treatment at our [facility] office if symptoms persist and lieu of a cortisone injection.  She will follow-up as needed.  This note was dictated using Dragon naturally speaking software and may contain errors in syntax, spelling, or content which have not been identified prior to signing this note.

## 2023-05-28 NOTE — Patient Instructions (Signed)
Do your exercises daily.  Purchase some gel cups. ICE twice daily using ice bath. Consider a [place] sock. Consider shockwave therapy at our [facility] office. [phone]

## 2023-07-13 ENCOUNTER — Other Ambulatory Visit: Payer: Self-pay | Admitting: Family Medicine

## 2023-07-13 DIAGNOSIS — M810 Age-related osteoporosis without current pathological fracture: Secondary | ICD-10-CM

## 2023-09-01 ENCOUNTER — Ambulatory Visit (HOSPITAL_BASED_OUTPATIENT_CLINIC_OR_DEPARTMENT_OTHER)
Admission: RE | Admit: 2023-09-01 | Discharge: 2023-09-01 | Disposition: A | Discharge status: Home / Self Care | Payer: Medicare Other | Source: Ambulatory Visit | Attending: Family Medicine | Admitting: Family Medicine

## 2023-09-01 ENCOUNTER — Encounter (HOSPITAL_BASED_OUTPATIENT_CLINIC_OR_DEPARTMENT_OTHER): Payer: Self-pay

## 2023-09-01 DIAGNOSIS — Z1231 Encounter for screening mammogram for malignant neoplasm of breast: Secondary | ICD-10-CM | POA: Diagnosis not present

## 2023-09-25 ENCOUNTER — Other Ambulatory Visit: Payer: Self-pay | Admitting: Family Medicine

## 2023-09-25 DIAGNOSIS — I10 Essential (primary) hypertension: Secondary | ICD-10-CM

## 2023-09-28 ENCOUNTER — Other Ambulatory Visit: Payer: Self-pay | Admitting: Family Medicine

## 2023-09-28 DIAGNOSIS — F5102 Adjustment insomnia: Secondary | ICD-10-CM

## 2023-11-02 ENCOUNTER — Encounter: Payer: Self-pay | Admitting: Family Medicine

## 2023-11-03 NOTE — Telephone Encounter (Signed)
 Please advise. [facility] matches our immunization records.

## 2023-12-03 ENCOUNTER — Other Ambulatory Visit: Payer: Self-pay | Admitting: Family Medicine

## 2023-12-03 DIAGNOSIS — R0982 Postnasal drip: Secondary | ICD-10-CM

## 2023-12-03 DIAGNOSIS — F339 Major depressive disorder, recurrent, unspecified: Secondary | ICD-10-CM

## 2023-12-09 ENCOUNTER — Encounter: Payer: Self-pay | Admitting: Gastroenterology

## 2024-01-02 ENCOUNTER — Encounter: Payer: Self-pay | Admitting: Family Medicine

## 2024-01-17 ENCOUNTER — Encounter (INDEPENDENT_AMBULATORY_CARE_PROVIDER_SITE_OTHER): Payer: Self-pay | Admitting: Family Medicine

## 2024-01-17 DIAGNOSIS — U071 COVID-19: Secondary | ICD-10-CM | POA: Diagnosis not present

## 2024-01-18 MED ORDER — NIRMATRELVIR/RITONAVIR (PAXLOVID) TABLET (RENAL DOSING): 2.0000 | ORAL_TABLET | Freq: Two times a day (BID) | ORAL | 0 refills | Status: AC

## 2024-01-18 NOTE — Telephone Encounter (Signed)

## 2024-02-12 DIAGNOSIS — H5213 Myopia, bilateral: Secondary | ICD-10-CM | POA: Diagnosis not present

## 2024-03-16 ENCOUNTER — Ambulatory Visit (INDEPENDENT_AMBULATORY_CARE_PROVIDER_SITE_OTHER)

## 2024-03-16 VITALS — Ht 62.0 in | Wt 173.0 lb

## 2024-03-16 DIAGNOSIS — Z780 Asymptomatic menopausal state: Secondary | ICD-10-CM | POA: Diagnosis not present

## 2024-03-16 DIAGNOSIS — Z0000 Encounter for general adult medical examination without abnormal findings: Secondary | ICD-10-CM | POA: Diagnosis not present

## 2024-03-16 NOTE — Patient Instructions (Signed)
 Rebecca Lopez , Thank you for taking time out of your busy schedule to complete your Annual Wellness Visit with me. I enjoyed our conversation and look forward to speaking with you again next year. I, as well as your care team,  appreciate your ongoing commitment to your health goals. Please review the following plan we discussed and let me know if I can assist you in the future. Your Game plan/ To Do List    Referrals:  You have an order for:  []   2D Mammogram  []   3D Mammogram  [x]   Bone Density     Please call for appointment:   [facility] [address] [place], [place] [address] [phone]     Make sure to wear two-piece clothing.  No lotions, powders, or deodorants the day of the appointment. Make sure to bring picture ID and insurance card.  Bring list of medications you are currently taking including any supplements.   Follow up Visits: Next Medicare AWV with our clinical staff: In 1 year    Have you seen your provider in the last 6 months (3 months if uncontrolled diabetes)? No Next Office Visit with your provider: To be scheduled  Clinician Recommendations:  Aim for 30 minutes of exercise or brisk walking, 6-8 glasses of water, and 5 servings of fruits and vegetables each day.       This is a list of the screening recommended for you and due dates:  Health Maintenance  Topic Date Due   COVID-19 Vaccine (7 - 2024-25 season) 05/04/2023   Colon Cancer Screening  08/15/2023   DEXA scan (bone density measurement)  03/26/2024   Flu Shot  04/02/2024   Medicare Annual Wellness Visit  03/16/2025   DTaP/Tdap/Td vaccine (3 - Td or Tdap) 11/19/2032   Pneumococcal Vaccine for age over 15  Completed   Hepatitis C Screening  Completed   Zoster (Shingles) Vaccine  Completed   Hepatitis B Vaccine  Aged Out   HPV Vaccine  Aged Out   Meningitis B Vaccine  Aged Out    Advanced directives: (ACP Link)Information on Advanced Care  Planning can be found at [employer] Advance Health Care Directives Advance Health Care Directives. [email or web address]   Advance Care Planning is important because it:  [x]  Makes sure you receive the medical care that is consistent with your values, goals, and preferences  [x]  It provides guidance to your family and loved ones and reduces their decisional burden about whether or not they are making the right decisions based on your wishes.  Follow the link provided in your after visit summary or read over the paperwork we have mailed to you to help you started getting your Advance Directives in place. If you need assistance in completing these, please reach out to us  so that we can help you!  See attachments for Preventive Care and Fall Prevention Tips.

## 2024-03-16 NOTE — Progress Notes (Signed)
 Subjective:   Rebecca Lopez is a 78 y.o. who presents for a Medicare Wellness preventive visit.  As a reminder, Annual Wellness Visits don't include a physical exam, and some assessments may be limited, especially if this visit is performed virtually. We may recommend an in-person follow-up visit with your provider if needed.  Visit Complete: Virtual I connected with  Ronal GORMAN Gunther on 03/16/24 by a video and audio enabled telemedicine application and verified that I am speaking with the correct person using two identifiers.  Patient Location: Home  Provider Location: Home Office  I discussed the limitations of evaluation and management by telemedicine. The patient expressed understanding and agreed to proceed.  Vital Signs: Because this visit was a virtual/telehealth visit, some criteria may be missing or patient reported. Any vitals not documented were not able to be obtained and vitals that have been documented are patient reported.  Persons Participating in Visit: Patient.  AWV Questionnaire: No: Patient Medicare AWV questionnaire was not completed prior to this visit.  Cardiac Risk Factors include: advanced age (>76men, >67 women);dyslipidemia;hypertension     Objective:    Today's Vitals   03/16/24 1050  Weight: 173 lb (78.5 kg)  Height: 5' 2 (1.575 m)   Body mass index is 31.64 kg/m.     03/16/2024   11:07 AM 03/26/2023    8:58 AM 03/18/2022   11:32 AM 02/08/2021    1:39 PM 09/28/2019   12:56 PM 05/24/2018    7:58 PM 10/31/2016    9:13 AM  Advanced Directives  Does Patient Have a Medical Advance Directive? No No No No No No  No   Does patient want to make changes to medical advance directive?    Yes (MAU/Ambulatory/Procedural Areas - Information given)     Would patient like information on creating a medical advance directive? Yes (MAU/Ambulatory/Procedural Areas - Information given) No - Patient declined   No - Patient declined No - Patient declined  Yes  (MAU/Ambulatory/Procedural Areas - Information given)      Data saved with a previous flowsheet row definition    Current Medications (verified) Outpatient Encounter Medications as of 03/16/2024  Medication Sig   alendronate  (FOSAMAX ) 70 MG tablet Take 1 tablet (70 mg total) by mouth every 7 (seven) days. Take with a full glass of water on an empty stomach.   buPROPion  (WELLBUTRIN  XL) 150 MG 24 hr tablet Take 2 tablets (300 mg total) by mouth daily.   cholecalciferol (VITAMIN D ) 1000 units tablet Take 1,000 Units by mouth daily.   fexofenadine  (ALLEGRA ) 60 MG tablet Take 60 mg by mouth 2 (two) times daily.   ibuprofen (ADVIL) 200 MG tablet Take 400 mg by mouth daily.   ipratropium (ATROVENT ) 0.03 % nasal spray Place 2 sprays into both nostrils every 12 (twelve) hours.   losartan -hydrochlorothiazide  (HYZAAR) 50-12.5 MG tablet TAKE 1/2 TABLET BY MOUTH DAILY   Multiple Vitamin (MULTIVITAMIN) tablet Take 1 tablet by mouth daily.   traZODone  (DESYREL ) 50 MG tablet TAKE 1/2 TO 1 TABLET BY MOUTH EVERY NIGHT AT BEDTIME AS NEEDED FOR SLEEP   No facility-administered encounter medications on file as of 03/16/2024.    Allergies (verified) Amlodipine  and Fosamax  [alendronate  sodium]   History: Past Medical History:  Diagnosis Date   Allergy    Anemia    before hysterectomy    Arthritis    Cataract    beginnings    Chronic kidney disease    kidney stone   Depression  Duodenal ulcer    GERD (gastroesophageal reflux disease)    Hyperlipidemia    Hypertension    Neuromuscular disorder (HCC)    neuropathy, CTS   Osteoporosis    Past Surgical History:  Procedure Laterality Date   ABDOMINAL HYSTERECTOMY     APPENDECTOMY     arm surgery     BARIATRIC SURGERY     COLONOSCOPY     CTR Bilateral 2005   had right hand done 1999   EYE SURGERY     POLYPECTOMY     TONSILLECTOMY     Family History  Problem Relation Age of Onset   Hypertension Mother    Colon cancer Mother        pt  states had no chemo, radiation    Colon polyps Mother    Heart disease Father    Cancer Sister    Colon cancer Sister 1       late 19's   Stroke Maternal Grandfather    Social History   Socioeconomic History   Marital status: Divorced    Spouse name: Not on file   Number of children: Not on file   Years of education: Not on file   Highest education level: Not on file  Occupational History   Not on file  Tobacco Use   Smoking status: Never   Smokeless tobacco: Never  Substance and Sexual Activity   Alcohol use: Yes    Comment: gin and wine 2x/month   Drug use: No   Sexual activity: Yes  Other Topics Concern   Not on file  Social History Narrative   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    Social Drivers of Health   Financial Resource Strain: Low Risk  (03/16/2024)   Overall Financial Resource Strain (CARDIA)    Difficulty of Paying Living Expenses: Not hard at all  Food Insecurity: No Food Insecurity (03/16/2024)   Hunger Vital Sign    Worried About Running Out of Food in the Last Year: Never true    Ran Out of Food in the Last Year: Never true  Transportation Needs: No Transportation Needs (03/16/2024)   PRAPARE - Administrator, Civil Service (Medical): No    Lack of Transportation (Non-Medical): No  Physical Activity: Inactive (03/16/2024)   Exercise Vital Sign    Days of Exercise per Week: 0 days    Minutes of Exercise per Session: 0 min  Stress: No Stress Concern Present (03/16/2024)   [name] of Occupational Health - Occupational Stress Questionnaire    Feeling of Stress: Only a little  Social Connections: Socially Isolated (03/16/2024)   Social Connection and Isolation Panel    Frequency of Communication with Friends and Family: More than three times a week    Frequency of Social Gatherings with Friends and Family: More than three times a week    Attends Religious Services: Never    Database administrator or Organizations: No    Attends Museum/gallery exhibitions officer: Never    Marital Status: Divorced    Tobacco Counseling Counseling given: Not Answered    Clinical Intake:  Pre-visit preparation completed: Yes  Pain : No/denies pain     Diabetes: No  Lab Results  Component Value Date   HGBA1C 5.4 05/28/2023   HGBA1C 5.5 08/14/2022   HGBA1C 5.3 03/28/2021     How often do you need to have someone help you when you read instructions, pamphlets, or other written materials from your doctor or  pharmacy?: 1 - Never  Interpreter Needed?: No  Information entered by :: Charmaine Bloodgood LPN   Activities of Daily Living     03/16/2024   11:07 AM 03/26/2023    8:49 AM  In your present state of health, do you have any difficulty performing the following activities:  Hearing? 0 1  Comment  slight hearing loss  Vision? 0 0  Difficulty concentrating or making decisions? 0 0  Walking or climbing stairs? 0 1  Dressing or bathing? 0 0  Doing errands, shopping? 0 0  Preparing Food and eating ? N N  Using the Toilet? N N  In the past six months, have you accidently leaked urine? N Y  Do you have problems with loss of bowel control? N N  Managing your Medications? N N  Managing your Finances? N N  Housekeeping or managing your Housekeeping? N N    Patient Care Team: Copland, Harlene BROCKS, MD as PCP - General (Family Medicine) Cleotilde Elspeth CROME, OD (Optometry)  I have updated your Care Teams any recent Medical Services you may have received from other providers in the past year.     Assessment:   This is a routine wellness examination for [facility].  Hearing/Vision screen Hearing Screening - Comments:: Denies hearing difficulties   Vision Screening - Comments:: Wears rx glasses - up to date with routine eye exams with Dr. Elspeth Cleotilde    Goals Addressed             This Visit's Progress    Maintain health and independence   On track      Depression Screen     03/16/2024   11:03 AM 05/28/2023    8:39 AM  03/26/2023    8:57 AM 08/14/2022   11:22 AM 08/14/2022   11:08 AM 03/18/2022   11:36 AM 03/28/2021   11:46 AM  PHQ 2/9 Scores  PHQ - 2 Score 0 0 0 0 0 0 5  PHQ- 9 Score  0  0   9    Fall Risk     03/16/2024   11:07 AM 03/26/2023    8:52 AM 08/14/2022   11:08 AM 03/18/2022   11:35 AM 03/28/2021   11:18 AM  Fall Risk   Falls in the past year? 0 1 0 0 1  Comment  [place]     Number falls in past yr: 0 0 0 0 1  Injury with Fall? 0 0 0 0 1  Risk for fall due to : No Fall Risks No Fall Risks No Fall Risks  Impaired balance/gait;History of fall(s)  Follow up Falls prevention discussed;Education provided;Falls evaluation completed Falls evaluation completed Falls evaluation completed  Falls prevention discussed  Falls evaluation completed      Data saved with a previous flowsheet row definition    MEDICARE RISK AT HOME:  Medicare Risk at Home Any stairs in or around the home?: No If so, are there any without handrails?: No Home free of loose throw rugs in walkways, pet beds, electrical cords, etc?: Yes Adequate lighting in your home to reduce risk of falls?: Yes Life alert?: No Use of a cane, walker or w/c?: No Grab bars in the bathroom?: Yes Shower chair or bench in shower?: No Elevated toilet seat or a handicapped toilet?: Yes  TIMED UP AND GO:  Was the test performed?  No  Cognitive Function: Declined/Normal: No cognitive concerns noted by patient  or family. Patient alert, oriented, able to answer questions appropriately and recall recent events. No signs of memory loss or confusion.        03/26/2023    9:00 AM 02/08/2021    1:55 PM  6CIT Screen  What Year? 0 points 0 points  What month? 0 points 0 points  What time? 0 points 0 points  Count back from 20 0 points 0 points  Months in reverse 0 points 0 points  Repeat phrase 0 points 0 points  Total Score 0 points 0 points    Immunizations Immunization History  Administered  Date(s) Administered   Fluad Quad(high Dose 65+) 05/31/2019, 08/09/2020, 06/12/2021, 06/19/2022   Fluad Trivalent(High Dose 65+) 05/28/2023   Influenza, High Dose Seasonal PF 09/25/2017, 07/08/2018   Influenza,inj,Quad PF,6+ Mos 08/10/2013, 08/16/2014   Moderna Covid-19 Vaccine  Bivalent Booster 74yrs & up 06/14/2021   Moderna SARS-COV2 Booster Vaccination 08/07/2020   Moderna Sars-Covid-2 Vaccination 10/14/2019, 11/12/2019, 07/03/2022   PFIZER(Purple Top)SARS-COV-2 Vaccination 01/17/2021   Pneumococcal Conjugate-13 10/31/2016   Pneumococcal Polysaccharide-23 07/08/2018   Tdap 03/24/2015, 11/20/2022   Zoster Recombinant(Shingrix) 12/26/2021, 06/27/2022    Screening Tests Health Maintenance  Topic Date Due   COVID-19 Vaccine (7 - 2024-25 season) 05/04/2023   Colonoscopy  08/15/2023   DEXA SCAN  03/26/2024   INFLUENZA VACCINE  04/02/2024   Medicare Annual Wellness (AWV)  03/16/2025   DTaP/Tdap/Td (3 - Td or Tdap) 11/19/2032   Pneumococcal Vaccine: 50+ Years  Completed   Hepatitis C Screening  Completed   Zoster Vaccines- Shingrix  Completed   Hepatitis B Vaccines  Aged Out   HPV VACCINES  Aged Out   Meningococcal B Vaccine  Aged Out    Health Maintenance  Health Maintenance Due  Topic Date Due   COVID-19 Vaccine (7 - 2024-25 season) 05/04/2023   Colonoscopy  08/15/2023   Health Maintenance Items Addressed: DEXA ordered  Additional Screening:  Vision Screening: Recommended annual ophthalmology exams for early detection of glaucoma and other disorders of the eye. Would you like a referral to an eye doctor? No    Dental Screening: Recommended annual dental exams for proper oral hygiene  Community Resource Referral / Chronic Care Management: CRR required this visit?  No   CCM required this visit?  No   Plan:    I have personally reviewed and noted the following in the patient's chart:   Medical and social history Use of alcohol, tobacco or illicit drugs  Current  medications and supplements including opioid prescriptions. Patient is not currently taking opioid prescriptions. Functional ability and status Nutritional status Physical activity Advanced directives List of other physicians Hospitalizations, surgeries, and ER visits in previous 12 months Vitals Screenings to include cognitive, depression, and falls Referrals and appointments  In addition, I have reviewed and discussed with patient certain preventive protocols, quality metrics, and best practice recommendations. A written personalized care plan for preventive services as well as general preventive health recommendations were provided to patient.   Lavelle Pfeiffer [place], [place]   2/84/7974   After Visit Summary: (MyChart) Due to this being a telephonic visit, the after visit summary with patients personalized plan was offered to patient via MyChart   Notes: Nothing significant to report at this time.

## 2024-03-28 ENCOUNTER — Ambulatory Visit (INDEPENDENT_AMBULATORY_CARE_PROVIDER_SITE_OTHER)

## 2024-03-28 ENCOUNTER — Inpatient Hospital Stay (HOSPITAL_COMMUNITY)
Admission: RE | Admit: 2024-03-28 | Discharge: 2024-03-28 | Disposition: A | Discharge status: Home / Self Care | Payer: Self-pay | Source: Ambulatory Visit

## 2024-03-28 ENCOUNTER — Ambulatory Visit (HOSPITAL_COMMUNITY)
Admission: EM | Admit: 2024-03-28 | Discharge: 2024-03-28 | Disposition: A | Discharge status: Home / Self Care | Attending: Physician Assistant | Admitting: Physician Assistant

## 2024-03-28 ENCOUNTER — Encounter (HOSPITAL_COMMUNITY): Payer: Self-pay | Admitting: *Deleted

## 2024-03-28 ENCOUNTER — Other Ambulatory Visit: Payer: Self-pay

## 2024-03-28 DIAGNOSIS — M79671 Pain in right foot: Secondary | ICD-10-CM

## 2024-03-28 DIAGNOSIS — M7989 Other specified soft tissue disorders: Secondary | ICD-10-CM | POA: Diagnosis not present

## 2024-03-28 DIAGNOSIS — M10071 Idiopathic gout, right ankle and foot: Secondary | ICD-10-CM | POA: Diagnosis not present

## 2024-03-28 DIAGNOSIS — M85871 Other specified disorders of bone density and structure, right ankle and foot: Secondary | ICD-10-CM | POA: Diagnosis not present

## 2024-03-28 MED ORDER — METHYLPREDNISOLONE SODIUM SUCC 125 MG IJ SOLR
INTRAMUSCULAR | Status: AC
  Filled 2024-03-28: qty 2

## 2024-03-28 MED ORDER — METHYLPREDNISOLONE SODIUM SUCC 125 MG IJ SOLR
80.0000 mg | Freq: Once | INTRAMUSCULAR | Status: AC
  Administered 2024-03-28: 80 mg via INTRAMUSCULAR

## 2024-03-28 NOTE — ED Provider Notes (Signed)
 [facility]    CSN: [id number] Arrival date & time: 03/28/24  1059      History   Chief Complaint Chief Complaint  Patient presents with   Foot Pain    HPI Rebecca Lopez is a 78 y.o. female with complaints of right foot pain for the last week.  Patient reports that the onset has been gradual.  No known injury.  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  States that normally she does have fairly swollen feet and has had Doppler studies done in the past, which have been negative.  Reports that the last few days she has started to have new redness around the base of her great toe and this is the area of the worst tenderness.  It is painful to walk.  She states she has been hobbling around and this is starting to aggravate her right knee as well.  She does not have a history of gout, but she is highly suspicious that this might be the case.  Denies any fever or chills.  She is not diabetic.  She does have a history of osteoporosis and takes Fosamax  once weekly.  She has been taking Advil at home for pain and swelling.  Past Medical History:  Diagnosis Date   Allergy    Anemia    before hysterectomy    Arthritis    Cataract    beginnings    Chronic kidney disease    kidney stone   Depression    Duodenal ulcer    GERD (gastroesophageal reflux disease)    Hyperlipidemia    Hypertension    Neuromuscular disorder (HCC)    neuropathy, CTS   Osteoporosis     Patient Active Problem List   Diagnosis Date Noted   Osteoporosis 09/30/2017   Hyperlipidemia 10/04/2014   Essential hypertension 10/04/2014   Vitamin D  deficiency 10/04/2014   Family history of malignant neoplasm of gastrointestinal tract 03/10/2013    Past Surgical History:  Procedure Laterality Date   ABDOMINAL HYSTERECTOMY     APPENDECTOMY     arm surgery      BARIATRIC SURGERY     COLONOSCOPY     CTR Bilateral 2005   had right hand done 1999   EYE SURGERY     POLYPECTOMY     TONSILLECTOMY      OB History   No obstetric history on file.      Home Medications    Prior to Admission medications   Medication Sig Start Date End Date Taking? Authorizing Provider  alendronate  (FOSAMAX ) 70 MG tablet Take 1 tablet (70 mg total) by mouth every 7 (seven) days. Take with a full glass of water on an empty stomach. 05/28/23  Yes Copland, Harlene BROCKS, MD  buPROPion  (WELLBUTRIN  XL) 150 MG 24 hr tablet Take 2 tablets (300 mg total) by mouth daily. 12/03/23  Yes Copland, Harlene BROCKS, MD  fexofenadine  (ALLEGRA ) 60 MG tablet Take 60 mg by mouth 2 (two) times daily.   Yes [provider]  ibuprofen (ADVIL) 200 MG tablet Take 400 mg by mouth daily.   Yes [provider]  ipratropium (ATROVENT ) 0.03 % nasal spray Place 2 sprays into both nostrils every 12 (twelve) hours. 12/03/23  Yes Copland, Harlene BROCKS, MD  losartan -hydrochlorothiazide  (HYZAAR) 50-12.5 MG  tablet TAKE 1/2 TABLET BY MOUTH DAILY 09/25/23  Yes Copland, Jessica C, MD  Multiple Vitamin (MULTIVITAMIN) tablet Take 1 tablet by mouth daily.   Yes [provider]  traZODone  (DESYREL ) 50 MG tablet TAKE 1/2 TO 1 TABLET BY MOUTH EVERY NIGHT AT BEDTIME AS NEEDED FOR SLEEP 09/29/23  Yes Copland, Jessica C, MD  cholecalciferol (VITAMIN D ) 1000 units tablet Take 1,000 Units by mouth daily.    [provider]    Family History Family History  Problem Relation Age of Onset   Hypertension Mother    Colon cancer Mother        pt states had no chemo, radiation    Colon polyps Mother    Heart disease Father    Cancer Sister    Colon cancer Sister 59       late 75's   Stroke Maternal Grandfather     Social History Social History   Tobacco Use   Smoking status: Never   Smokeless tobacco: Never  Substance Use Topics   Alcohol use: Yes    Comment: gin and wine 2x/month   Drug  use: No     Allergies   Amlodipine  and Fosamax  [alendronate  sodium]   Review of Systems Review of Systems   Physical Exam Triage Vital Signs ED Triage Vitals  Encounter Vitals Group     BP 03/28/24 1119 123/86     Girls Systolic BP Percentile --      Girls Diastolic BP Percentile --      Boys Systolic BP Percentile --      Boys Diastolic BP Percentile --      Pulse Rate 03/28/24 1119 75     Resp 03/28/24 1119 20     Temp 03/28/24 1119 98.3 F (36.8 C)     Temp src --      SpO2 03/28/24 1119 95 %     Weight --      Height --      Head Circumference --      Peak Flow --      Pain Score 03/28/24 1117 5     Pain Loc --      Pain Education --      Exclude from Growth Chart --    No data found.  Updated Vital Signs BP 123/86   Pulse 75   Temp 98.3 F (36.8 C)   Resp 20   SpO2 95%    Physical Exam Vitals and nursing note reviewed.  Constitutional:      Appearance: Normal appearance.  Musculoskeletal:        General: Swelling and tenderness present.     Comments: Focused exam of the right lower extremity reveals swelling on the dorsum of the foot.  There is an area of erythema at the right great MTP joint.  This is painful upon palpation.  Her sensation is intact.  Pedal pulses are intact.  Range of motion is normal.  Skin:    Findings: Erythema present.  Neurological:     Mental Status: She is alert.      UC Treatments / Results  Labs (all labs ordered are listed, but only abnormal results are displayed) Labs Reviewed - No data to display  EKG   Radiology DG Foot Complete Right Result Date: 03/28/2024 CLINICAL DATA:  Right foot pain. EXAM: RIGHT FOOT COMPLETE - 3+ VIEW COMPARISON:  None Available. FINDINGS: No acute fracture or dislocation. The bones are osteopenic. There is soft tissue swelling of  the forefoot. No opaque foreign objects or gas. IMPRESSION: 1. No acute fracture or dislocation. 2. Soft tissue swelling of the forefoot. Electronically  Signed   By: Vanetta Chou M.D.   On: 03/28/2024 12:10    Procedures Procedures (including critical care time)  Medications Ordered in UC Medications  methylPREDNISolone  sodium succinate (SOLU-MEDROL ) 125 mg/2 mL injection 80 mg (80 mg Intramuscular Given 03/28/24 1207)    Initial Impression / Assessment and Plan / UC Course  I have reviewed the triage vital signs and the nursing notes.  Pertinent labs & imaging results that were available during my care of the patient were reviewed by me and considered in my medical decision making (see chart for details).     Exam of right foot consistent with acute gout flare.  No known history of gout.  I personally reviewed her x-ray and impression, which was negative for acute fracture, this was important to check given her history of osteoporosis.  Patient did report that she was feeling a little bit better today.  I think an injection of Solu-Medrol  here at the urgent care should help her symptoms tremendously.  She was also given a boot to wear with walking.  Instructed to elevate above heart level at home.  She needs to stay well-hydrated.  Low purine diet.  Follow-up with PCP for chronic gout prevention. Final Clinical Impressions(s) / UC Diagnoses   Final diagnoses:  Acute foot pain, right  Acute idiopathic gout of right foot     Discharge Instructions      Good to meet you today.  We are still waiting for your official radiology report, but at this time I do not see any acute fracture in the area of your pain.  Your symptoms appear most consistent with gout.  You were given a steroid injection today, which should help with symptoms.  Wearing the boot should also help.  Low purine eating plan should help with future gout flareups.  Please follow-up with your primary care as well.  Try to keep the foot elevated above heart level this week to help with swelling and healing.     ED Prescriptions   None    PDMP not reviewed this  encounter.   AllwardtMardy HERO, PA-C 03/28/24 1235

## 2024-03-28 NOTE — ED Triage Notes (Signed)
 Pt reports RT foot pain for one week. Unsure if injury was involved. Pt did move a wheel chair in and out of a car last week.

## 2024-03-28 NOTE — Discharge Instructions (Signed)
 Good to meet you today.  We are still waiting for your official radiology report, but at this time I do not see any acute fracture in the area of your pain.  Your symptoms appear most consistent with gout.  You were given a steroid injection today, which should help with symptoms.  Wearing the boot should also help.  Low purine eating plan should help with future gout flareups.  Please follow-up with your primary care as well.  Try to keep the foot elevated above heart level this week to help with swelling and healing.

## 2024-04-12 ENCOUNTER — Ambulatory Visit (HOSPITAL_BASED_OUTPATIENT_CLINIC_OR_DEPARTMENT_OTHER)
Admission: RE | Admit: 2024-04-12 | Discharge: 2024-04-12 | Disposition: A | Discharge status: Home / Self Care | Source: Ambulatory Visit | Attending: Family Medicine | Admitting: Family Medicine

## 2024-04-12 DIAGNOSIS — M810 Age-related osteoporosis without current pathological fracture: Secondary | ICD-10-CM | POA: Diagnosis not present

## 2024-04-12 DIAGNOSIS — Z780 Asymptomatic menopausal state: Secondary | ICD-10-CM | POA: Insufficient documentation

## 2024-04-14 ENCOUNTER — Encounter: Payer: Self-pay | Admitting: Family Medicine

## 2024-04-23 DIAGNOSIS — H18413 Arcus senilis, bilateral: Secondary | ICD-10-CM | POA: Diagnosis not present

## 2024-04-23 DIAGNOSIS — H02831 Dermatochalasis of right upper eyelid: Secondary | ICD-10-CM | POA: Diagnosis not present

## 2024-04-23 DIAGNOSIS — H26491 Other secondary cataract, right eye: Secondary | ICD-10-CM | POA: Diagnosis not present

## 2024-04-23 DIAGNOSIS — H26493 Other secondary cataract, bilateral: Secondary | ICD-10-CM | POA: Diagnosis not present

## 2024-04-23 DIAGNOSIS — Z961 Presence of intraocular lens: Secondary | ICD-10-CM | POA: Diagnosis not present

## 2024-04-30 DIAGNOSIS — H26492 Other secondary cataract, left eye: Secondary | ICD-10-CM | POA: Diagnosis not present

## 2024-04-30 DIAGNOSIS — H2511 Age-related nuclear cataract, right eye: Secondary | ICD-10-CM | POA: Diagnosis not present

## 2024-04-30 DIAGNOSIS — H53143 Visual discomfort, bilateral: Secondary | ICD-10-CM | POA: Diagnosis not present

## 2024-05-19 ENCOUNTER — Other Ambulatory Visit: Payer: Self-pay | Admitting: Family Medicine

## 2024-05-19 DIAGNOSIS — F339 Major depressive disorder, recurrent, unspecified: Secondary | ICD-10-CM

## 2024-06-13 ENCOUNTER — Other Ambulatory Visit: Payer: Self-pay | Admitting: Family Medicine

## 2024-06-13 DIAGNOSIS — M810 Age-related osteoporosis without current pathological fracture: Secondary | ICD-10-CM

## 2024-06-15 ENCOUNTER — Other Ambulatory Visit: Payer: Self-pay | Admitting: Family Medicine

## 2024-06-15 DIAGNOSIS — R0982 Postnasal drip: Secondary | ICD-10-CM

## 2024-08-14 NOTE — Progress Notes (Unsigned)
 [facility], [place] [address], [address] [place], [place] [address] [phone]  Date:  08/16/2024   Name:  Rebecca Lopez   DOB:  04-05-1946   MRN:  [id number]  PCP:  Watt Harlene BROCKS, MD    Chief Complaint: Sinus Problem (Sinus pressure and congestion onset a couple of weeks ago/)   History of Present Illness:  Rebecca Lopez is a 78 y.o. very pleasant female patient who presents with the following:  Patient seen today with concern of upper respiratory symptoms I saw her most recently in September 2024-that time her sister was in an assisted living and on hospice care. Her sister Rebecca Lopez is also my patient- she will be moving to an SNF from her current AL.  She will need to move soon History of hypertension, hyperlipidemia, vitamin D  deficiency, osteoporosis   -Colon cancer screening completed 2019, GI has reached out to her to reschedule  - Flu shot; done  - Recommend COVID booster- done  - She has completed Shingrix -Recommend RSV- she reports done -Can update labs today if she would like - Mammogram 1 year ago; she will call and schedule - DEXA scan up-to-date  She notes her ears feel clogged  Discussed the use of AI scribe software for clinical note transcription with the patient, who gave verbal consent to proceed.  History of Present Illness Rebecca Lopez is a 78 year old female who presents with sinus problems and right ear pain.  She has been experiencing sinus problems and right ear pain for the past couple of weeks. Her symptoms include a sensation of her jaw 'cracking,' which she believes may be unrelated. She manages her allergies with Allegra , taken once daily, and Atrovent  nasal spray, but her symptoms have persisted. Her right ear feels more clogged than the left, and she is open to having her ears cleaned out. She reports no significant pain in her sinuses but feels pressure in her head.  No chest  involvement, with symptoms localized to her head. She has received her flu shot and a COVID booster, but is unsure about the RSV vaccine.   Patient Active Problem List   Diagnosis Date Noted   Osteoporosis 09/30/2017   Hyperlipidemia 10/04/2014   Essential hypertension 10/04/2014   Vitamin D  deficiency 10/04/2014   Family history of malignant neoplasm of gastrointestinal tract 03/10/2013    Past Medical History:  Diagnosis Date   Allergy    Anemia    before hysterectomy    Arthritis    Cataract    beginnings    Chronic kidney disease    kidney stone   Depression    Duodenal ulcer    GERD (gastroesophageal reflux disease)    Hyperlipidemia    Hypertension    Neuromuscular disorder (HCC)    neuropathy, CTS   Osteoporosis     Past Surgical History:  Procedure Laterality Date   ABDOMINAL HYSTERECTOMY     APPENDECTOMY     arm surgery     BARIATRIC SURGERY     COLONOSCOPY     CTR Bilateral 2005   had right hand done 1999   EYE SURGERY     POLYPECTOMY     TONSILLECTOMY      Social History[1]  Family History  Problem Relation Age of Onset   Hypertension Mother    Colon cancer Mother        pt states had no chemo, radiation  Colon polyps Mother    Heart disease Father    Cancer Sister    Colon cancer Sister 26       late 76's   Stroke Maternal Grandfather     Allergies[2]  Medication list has been reviewed and updated.  Medications Ordered Prior to Encounter[3]  Review of Systems:  As per HPI- otherwise negative.   Physical Examination: Vitals:   08/16/24 1308  BP: (!) 140/82  Pulse: (!) 59  Temp: 98.1 F (36.7 C)  SpO2: 99%   Vitals:   08/16/24 1308  Weight: 185 lb (83.9 kg)  Height: 5' 2 (1.575 m)   Body mass index is 33.84 kg/m. Ideal Body Weight: Weight in (lb) to have BMI = 25: 136.4  GEN: no acute distress. Mild obesity, looks well  HEENT: Atraumatic, Normocephalic.  Cerumen impaction bilaterally Sinus tenderness to  percussion and nasal cavity is inflamed  Ears and Nose: No external deformity. CV: RRR, No M/G/R. No JVD. No thrill. No extra heart sounds. PULM: CTA B, no wheezes, crackles, rhonchi. No retractions. No resp. distress. No accessory muscle use. ABD: S, NT, ND, +BS. No rebound. No HSM. EXTR: No c/c/e PSYCH: Normally interactive. Conversant.   VC obtained and both ears are irrigated with warm water, cerumen impaction resolved and TM normal  Assessment and Plan: Essential hypertension - Plan: CBC, Comprehensive metabolic panel with GFR  Mixed hyperlipidemia - Plan: Lipid panel  Screening for diabetes mellitus - Plan: Hemoglobin A1c  Thyroid  disorder screening - Plan: TSH  Acute non-recurrent frontal sinusitis - Plan: doxycycline  (VIBRAMYCIN ) 100 MG capsule  Bilateral impacted cerumen  Assessment & Plan Acute frontal sinusitis Symptoms suggest bacterial sinusitis due to duration. - Prescribed doxycycline . - Continue Allegra  and Atrovent .  Impacted cerumen Right ear discomfort due to cerumen impaction. - Performed ear irrigation.  General health maintenance Routine blood work and vaccinations discussed. - Ordered routine blood work. - Confirmed flu shot, COVID booster, and RSV vaccine received. ** with labs remind her colon is due for update  Signed Harlene Schroeder, MD   Received labs. 12/16- message to pt  Results for orders placed or performed in visit on 08/16/24  CBC   Collection Time: 08/16/24  2:03 PM  Result Value Ref Range   WBC 7.6 4.0 - 10.5 K/uL   RBC 4.39 3.87 - 5.11 Mil/uL   Platelets 230.0 150.0 - 400.0 K/uL   Hemoglobin 14.0 12.0 - 15.0 g/dL   HCT 58.6 63.9 - 53.9 %   MCV 94.0 78.0 - 100.0 fl   MCHC 33.9 30.0 - 36.0 g/dL   RDW 85.8 88.4 - 84.4 %  Comprehensive metabolic panel with GFR   Collection Time: 08/16/24  2:03 PM  Result Value Ref Range   Sodium 141 135 - 145 mEq/L   Potassium 3.6 3.5 - 5.1 mEq/L   Chloride 105 96 - 112 mEq/L   CO2 26 19 -  32 mEq/L   Glucose, Bld 89 70 - 99 mg/dL   BUN 17 6 - 23 mg/dL   Creatinine, Ser 8.94 0.40 - 1.20 mg/dL   Total Bilirubin 0.4 0.2 - 1.2 mg/dL   Alkaline Phosphatase 85 39 - 117 U/L   AST 18 5 - 37 U/L   ALT 14 0 - 35 U/L   Total Protein 6.4 6.0 - 8.3 g/dL   Albumin 3.9 3.5 - 5.2 g/dL   GFR 48.47 (L) >39.99 mL/min   Calcium 9.2 8.4 - 10.5 mg/dL  Hemoglobin J8r  Collection Time: 08/16/24  2:03 PM  Result Value Ref Range   Hgb A1c MFr Bld 5.2 4.6 - 6.5 %  Lipid panel   Collection Time: 08/16/24  2:03 PM  Result Value Ref Range   Cholesterol 223 (H) 28 - 200 mg/dL   Triglycerides 897.9 0.0 - 149.0 mg/dL   HDL 43.09 >60.99 mg/dL   VLDL 79.5 0.0 - 59.9 mg/dL   LDL Cholesterol 853 (H) 0 - 99 mg/dL   Total CHOL/HDL Ratio 4    NonHDL 166.38   TSH   Collection Time: 08/16/24  2:03 PM  Result Value Ref Range   TSH 2.61 0.35 - 5.50 uIU/mL       [1]  Social History Tobacco Use   Smoking status: Never   Smokeless tobacco: Never  Substance Use Topics   Alcohol use: Yes    Comment: gin and wine 2x/month   Drug use: No  [2]  Allergies Allergen Reactions   Amlodipine      Possible small itchy spots on skin   Fosamax  [Alendronate  Sodium] Other (See Comments)    Achy after receiving   [3]  Current Outpatient Medications on File Prior to Visit  Medication Sig Dispense Refill   alendronate  (FOSAMAX ) 70 MG tablet TAKE 1 TABLET BY MOUTH ONCE WEEKLY ON AN EMPTY STOMACH BEFORE BREAKFAST. REMAIN UPRIGHT FOR 30 MINUTES AND TAKE WITH 8 OUNCES OF WATER 12 tablet 3   buPROPion  (WELLBUTRIN  XL) 150 MG 24 hr tablet TAKE 2 TABLETS BY MOUTH DAILY 180 tablet 0   cholecalciferol (VITAMIN D ) 1000 units tablet Take 1,000 Units by mouth daily.     fexofenadine  (ALLEGRA ) 60 MG tablet Take 60 mg by mouth 2 (two) times daily.     ibuprofen (ADVIL) 200 MG tablet Take 400 mg by mouth daily.     ipratropium (ATROVENT ) 0.03 % nasal spray Place 2 sprays into both nostrils every 12 (twelve) hours. 90 mL 0    losartan -hydrochlorothiazide  (HYZAAR) 50-12.5 MG tablet TAKE 1/2 TABLET BY MOUTH DAILY 45 tablet 3   Multiple Vitamin (MULTIVITAMIN) tablet Take 1 tablet by mouth daily.     traZODone  (DESYREL ) 50 MG tablet TAKE 1/2 TO 1 TABLET BY MOUTH EVERY NIGHT AT BEDTIME AS NEEDED FOR SLEEP (Patient not taking: Reported on 08/16/2024) 30 tablet 3   No current facility-administered medications on file prior to visit.

## 2024-08-15 ENCOUNTER — Other Ambulatory Visit: Payer: Self-pay | Admitting: Family Medicine

## 2024-08-15 DIAGNOSIS — F339 Major depressive disorder, recurrent, unspecified: Secondary | ICD-10-CM

## 2024-08-16 ENCOUNTER — Ambulatory Visit: Admitting: Family Medicine

## 2024-08-16 ENCOUNTER — Encounter: Payer: Self-pay | Admitting: Family Medicine

## 2024-08-16 VITALS — BP 140/82 | HR 59 | Temp 98.1°F | Ht 62.0 in | Wt 185.0 lb

## 2024-08-16 DIAGNOSIS — E782 Mixed hyperlipidemia: Secondary | ICD-10-CM

## 2024-08-16 DIAGNOSIS — Z1329 Encounter for screening for other suspected endocrine disorder: Secondary | ICD-10-CM | POA: Diagnosis not present

## 2024-08-16 DIAGNOSIS — Z131 Encounter for screening for diabetes mellitus: Secondary | ICD-10-CM | POA: Diagnosis not present

## 2024-08-16 DIAGNOSIS — J0110 Acute frontal sinusitis, unspecified: Secondary | ICD-10-CM

## 2024-08-16 DIAGNOSIS — H6123 Impacted cerumen, bilateral: Secondary | ICD-10-CM | POA: Diagnosis not present

## 2024-08-16 DIAGNOSIS — I10 Essential (primary) hypertension: Secondary | ICD-10-CM | POA: Diagnosis not present

## 2024-08-16 MED ORDER — DOXYCYCLINE HYCLATE 100 MG PO CAPS: 100.0000 mg | ORAL_CAPSULE | Freq: Two times a day (BID) | ORAL | 0 refills | Status: AC

## 2024-08-17 ENCOUNTER — Encounter: Payer: Self-pay | Admitting: Family Medicine

## 2024-08-17 LAB — HEMOGLOBIN A1C: Hgb A1c MFr Bld: 5.2 % (ref 4.6–6.5)

## 2024-08-17 LAB — COMPREHENSIVE METABOLIC PANEL WITH GFR
ALT: 14 U/L (ref 0–35)
AST: 18 U/L (ref 5–37)
Albumin: 3.9 g/dL (ref 3.5–5.2)
Alkaline Phosphatase: 85 U/L (ref 39–117)
BUN: 17 mg/dL (ref 6–23)
CO2: 26 meq/L (ref 19–32)
Calcium: 9.2 mg/dL (ref 8.4–10.5)
Chloride: 105 meq/L (ref 96–112)
Creatinine, Ser: 1.05 mg/dL (ref 0.40–1.20)
GFR: 51.52 mL/min — ABNORMAL LOW (ref >60.00)
Glucose, Bld: 89 mg/dL (ref 70–99)
Potassium: 3.6 meq/L (ref 3.5–5.1)
Sodium: 141 meq/L (ref 135–145)
Total Bilirubin: 0.4 mg/dL (ref 0.2–1.2)
Total Protein: 6.4 g/dL (ref 6.0–8.3)

## 2024-08-17 LAB — LIPID PANEL
Cholesterol: 223 mg/dL — ABNORMAL HIGH (ref 28–200)
HDL: 56.9 mg/dL (ref >39.00)
LDL Cholesterol: 146 mg/dL — ABNORMAL HIGH (ref 0–99)
NonHDL: 166.38
Total CHOL/HDL Ratio: 4
Triglycerides: 102 mg/dL (ref 0.0–149.0)
VLDL: 20.4 mg/dL (ref 0.0–40.0)

## 2024-08-17 LAB — CBC
HCT: 41.3 % (ref 36.0–46.0)
Hemoglobin: 14 g/dL (ref 12.0–15.0)
MCHC: 33.9 g/dL (ref 30.0–36.0)
MCV: 94 fl (ref 78.0–100.0)
Platelets: 230 K/uL (ref 150.0–400.0)
RBC: 4.39 Mil/uL (ref 3.87–5.11)
RDW: 14.1 % (ref 11.5–15.5)
WBC: 7.6 K/uL (ref 4.0–10.5)

## 2024-08-17 LAB — TSH: TSH: 2.61 u[IU]/mL (ref 0.35–5.50)

## 2024-10-05 ENCOUNTER — Other Ambulatory Visit: Payer: Self-pay | Admitting: Family Medicine

## 2024-10-05 DIAGNOSIS — R0982 Postnasal drip: Secondary | ICD-10-CM

## 2025-03-22 ENCOUNTER — Ambulatory Visit
